# Patient Record
Sex: Female | Born: 1985 | Race: Asian | Hispanic: No | Marital: Single | Smoking: Former smoker
Health system: Southern US, Community
[De-identification: ages and names within clinical notes are randomized; demographics above are authoritative.]

## PROBLEM LIST (undated history)

## (undated) ENCOUNTER — Inpatient Hospital Stay (HOSPITAL_COMMUNITY): Payer: Self-pay

## (undated) DIAGNOSIS — A64 Unspecified sexually transmitted disease: Secondary | ICD-10-CM

## (undated) DIAGNOSIS — F32A Depression, unspecified: Secondary | ICD-10-CM

## (undated) DIAGNOSIS — F1121 Opioid dependence, in remission: Secondary | ICD-10-CM

## (undated) DIAGNOSIS — D563 Thalassemia minor: Secondary | ICD-10-CM

## (undated) DIAGNOSIS — M542 Cervicalgia: Secondary | ICD-10-CM

## (undated) DIAGNOSIS — R51 Headache: Secondary | ICD-10-CM

## (undated) DIAGNOSIS — R87629 Unspecified abnormal cytological findings in specimens from vagina: Secondary | ICD-10-CM

## (undated) DIAGNOSIS — M549 Dorsalgia, unspecified: Secondary | ICD-10-CM

## (undated) DIAGNOSIS — F329 Major depressive disorder, single episode, unspecified: Secondary | ICD-10-CM

## (undated) DIAGNOSIS — B999 Unspecified infectious disease: Secondary | ICD-10-CM

## (undated) DIAGNOSIS — B009 Herpesviral infection, unspecified: Secondary | ICD-10-CM

## (undated) DIAGNOSIS — G8929 Other chronic pain: Secondary | ICD-10-CM

## (undated) DIAGNOSIS — D649 Anemia, unspecified: Secondary | ICD-10-CM

## (undated) HISTORY — DX: Thalassemia minor: D56.3

## (undated) HISTORY — DX: Depression, unspecified: F32.A

## (undated) HISTORY — PX: NO PAST SURGERIES: SHX2092

## (undated) HISTORY — DX: Cervicalgia: M54.2

---
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
## 2008-01-28 ENCOUNTER — Emergency Department (HOSPITAL_COMMUNITY): Admission: EM | Admit: 2008-01-28 | Discharge: 2008-01-28 | Payer: Self-pay | Admitting: Emergency Medicine

## 2008-02-11 ENCOUNTER — Emergency Department (HOSPITAL_COMMUNITY): Admission: EM | Admit: 2008-02-11 | Discharge: 2008-02-11 | Payer: Self-pay | Admitting: Emergency Medicine

## 2008-06-16 ENCOUNTER — Emergency Department (HOSPITAL_COMMUNITY): Admission: EM | Admit: 2008-06-16 | Discharge: 2008-06-16 | Payer: Self-pay | Admitting: Emergency Medicine

## 2008-08-26 ENCOUNTER — Emergency Department (HOSPITAL_COMMUNITY): Admission: EM | Admit: 2008-08-26 | Discharge: 2008-08-26 | Payer: Self-pay | Admitting: Emergency Medicine

## 2009-04-07 ENCOUNTER — Emergency Department (HOSPITAL_COMMUNITY): Admission: EM | Admit: 2009-04-07 | Discharge: 2009-04-07 | Payer: Self-pay | Admitting: Emergency Medicine

## 2009-10-11 ENCOUNTER — Emergency Department (HOSPITAL_COMMUNITY): Admission: EM | Admit: 2009-10-11 | Discharge: 2009-10-11 | Payer: Self-pay | Admitting: Emergency Medicine

## 2010-04-12 ENCOUNTER — Emergency Department (HOSPITAL_COMMUNITY)
Admission: EM | Admit: 2010-04-12 | Discharge: 2010-04-13 | Payer: Self-pay | Source: Home / Self Care | Admitting: Emergency Medicine

## 2010-05-04 ENCOUNTER — Emergency Department (HOSPITAL_COMMUNITY)
Admission: EM | Admit: 2010-05-04 | Discharge: 2010-05-04 | Payer: Self-pay | Source: Home / Self Care | Admitting: Emergency Medicine

## 2010-05-04 LAB — URINE MICROSCOPIC-ADD ON

## 2010-05-04 LAB — URINALYSIS, ROUTINE W REFLEX MICROSCOPIC
Bilirubin Urine: NEGATIVE
Hemoglobin, Urine: NEGATIVE
Ketones, ur: NEGATIVE mg/dL
Nitrite: NEGATIVE
Protein, ur: NEGATIVE mg/dL
Specific Gravity, Urine: 1.026 (ref 1.005–1.030)
Urine Glucose, Fasting: NEGATIVE mg/dL
Urobilinogen, UA: 1 mg/dL (ref 0.0–1.0)
pH: 7 (ref 5.0–8.0)

## 2010-05-04 LAB — POCT PREGNANCY, URINE: Preg Test, Ur: NEGATIVE

## 2010-05-26 LAB — URINE CULTURE
Colony Count: >=100000
Culture  Setup Time: 201201042348

## 2010-07-12 ENCOUNTER — Emergency Department (HOSPITAL_COMMUNITY)
Admission: EM | Admit: 2010-07-12 | Discharge: 2010-07-12 | Disposition: A | Discharge status: Home / Self Care | Payer: Self-pay | Attending: Emergency Medicine | Admitting: Emergency Medicine

## 2010-07-12 DIAGNOSIS — R1110 Vomiting, unspecified: Secondary | ICD-10-CM | POA: Insufficient documentation

## 2010-07-12 DIAGNOSIS — R109 Unspecified abdominal pain: Secondary | ICD-10-CM | POA: Insufficient documentation

## 2010-07-12 LAB — URINALYSIS, ROUTINE W REFLEX MICROSCOPIC
Bilirubin Urine: NEGATIVE
Bilirubin Urine: NEGATIVE
Glucose, UA: NEGATIVE mg/dL
Glucose, UA: NEGATIVE mg/dL
Hgb urine dipstick: NEGATIVE
Hgb urine dipstick: NEGATIVE
Ketones, ur: NEGATIVE mg/dL
Ketones, ur: NEGATIVE mg/dL
Nitrite: NEGATIVE
Nitrite: NEGATIVE
Protein, ur: NEGATIVE mg/dL
Protein, ur: NEGATIVE mg/dL
Specific Gravity, Urine: 1.026 (ref 1.005–1.030)
Specific Gravity, Urine: 1.03 (ref 1.005–1.030)
Urobilinogen, UA: 0.2 mg/dL (ref 0.0–1.0)
Urobilinogen, UA: 1 mg/dL (ref 0.0–1.0)
pH: 7 (ref 5.0–8.0)
pH: 7.5 (ref 5.0–8.0)

## 2010-07-12 LAB — COMPREHENSIVE METABOLIC PANEL
ALT: 14 U/L (ref 0–35)
AST: 18 U/L (ref 0–37)
Albumin: 3.9 g/dL (ref 3.5–5.2)
Alkaline Phosphatase: 60 U/L (ref 39–117)
BUN: 17 mg/dL (ref 6–23)
CO2: 26 mEq/L (ref 19–32)
Calcium: 8.7 mg/dL (ref 8.4–10.5)
Chloride: 105 mEq/L (ref 96–112)
Creatinine, Ser: 0.77 mg/dL (ref 0.4–1.2)
GFR calc Af Amer: >60 mL/min (ref >60)
GFR calc non Af Amer: >60 mL/min (ref >60)
Glucose, Bld: 72 mg/dL (ref 70–99)
Potassium: 3.6 mEq/L (ref 3.5–5.1)
Sodium: 138 mEq/L (ref 135–145)
Total Bilirubin: 0.6 mg/dL (ref 0.3–1.2)
Total Protein: 7.5 g/dL (ref 6.0–8.3)

## 2010-07-12 LAB — POCT PREGNANCY, URINE
Preg Test, Ur: NEGATIVE
Preg Test, Ur: NEGATIVE

## 2010-07-12 LAB — DIFFERENTIAL
Basophils Absolute: 0 10*3/uL (ref 0.0–0.1)
Basophils Absolute: 0.1 10*3/uL (ref 0.0–0.1)
Basophils Relative: 0 % (ref 0–1)
Basophils Relative: 1 % (ref 0–1)
Eosinophils Absolute: 0.2 10*3/uL (ref 0.0–0.7)
Eosinophils Absolute: 0.2 10*3/uL (ref 0.0–0.7)
Eosinophils Relative: 2 % (ref 0–5)
Eosinophils Relative: 2 % (ref 0–5)
Lymphocytes Relative: 23 % (ref 12–46)
Lymphocytes Relative: 29 % (ref 12–46)
Lymphs Abs: 2.2 10*3/uL (ref 0.7–4.0)
Lymphs Abs: 3 10*3/uL (ref 0.7–4.0)
Monocytes Absolute: 0.7 10*3/uL (ref 0.1–1.0)
Monocytes Absolute: 0.8 10*3/uL (ref 0.1–1.0)
Monocytes Relative: 8 % (ref 3–12)
Monocytes Relative: 8 % (ref 3–12)
Neutro Abs: 6.3 10*3/uL (ref 1.7–7.7)
Neutro Abs: 6.3 10*3/uL (ref 1.7–7.7)
Neutrophils Relative %: 61 % (ref 43–77)
Neutrophils Relative %: 67 % (ref 43–77)

## 2010-07-12 LAB — WET PREP, GENITAL

## 2010-07-12 LAB — CBC
HCT: 42.2 % (ref 36.0–46.0)
HCT: 43.7 % (ref 36.0–46.0)
Hemoglobin: 13.2 g/dL (ref 12.0–15.0)
Hemoglobin: 15.4 g/dL — ABNORMAL HIGH (ref 12.0–15.0)
MCH: 24.2 pg — ABNORMAL LOW (ref 26.0–34.0)
MCH: 31.2 pg (ref 26.0–34.0)
MCHC: 31.3 g/dL (ref 30.0–36.0)
MCHC: 35.2 g/dL (ref 30.0–36.0)
MCV: 77.3 fL — ABNORMAL LOW (ref 78.0–100.0)
MCV: 88.5 fL (ref 78.0–100.0)
Platelets: 226 10*3/uL (ref 150–400)
Platelets: 299 10*3/uL (ref 150–400)
RBC: 4.94 MIL/uL (ref 3.87–5.11)
RBC: 5.46 MIL/uL — ABNORMAL HIGH (ref 3.87–5.11)
RDW: 13.3 % (ref 11.5–15.5)
RDW: 15.1 % (ref 11.5–15.5)
WBC: 10.3 10*3/uL (ref 4.0–10.5)
WBC: 9.4 10*3/uL (ref 4.0–10.5)

## 2010-07-12 LAB — BASIC METABOLIC PANEL
BUN: 16 mg/dL (ref 6–23)
CO2: 23 mEq/L (ref 19–32)
Calcium: 8.9 mg/dL (ref 8.4–10.5)
Chloride: 105 mEq/L (ref 96–112)
Creatinine, Ser: 0.78 mg/dL (ref 0.4–1.2)
GFR calc Af Amer: >60 mL/min (ref >60)
GFR calc non Af Amer: >60 mL/min (ref >60)
Glucose, Bld: 85 mg/dL (ref 70–99)
Potassium: 3.5 mEq/L (ref 3.5–5.1)
Sodium: 135 mEq/L (ref 135–145)

## 2010-07-12 LAB — URINE MICROSCOPIC-ADD ON

## 2010-07-12 LAB — GC/CHLAMYDIA PROBE AMP, GENITAL
Chlamydia, DNA Probe: NEGATIVE
GC Probe Amp, Genital: NEGATIVE

## 2010-07-12 LAB — LIPASE, BLOOD
Lipase: 46 U/L (ref 11–59)
Lipase: 48 U/L (ref 11–59)

## 2010-07-13 LAB — URINE CULTURE
Colony Count: >=100000
Culture  Setup Time: 201203132220

## 2010-07-18 LAB — RAPID STREP SCREEN (MED CTR MEBANE ONLY): Streptococcus, Group A Screen (Direct): NEGATIVE

## 2010-08-02 LAB — POCT I-STAT, CHEM 8
BUN: 12 mg/dL (ref 6–23)
Calcium, Ion: 1.2 mmol/L (ref 1.12–1.32)
Chloride: 106 mEq/L (ref 96–112)
Creatinine, Ser: 0.8 mg/dL (ref 0.4–1.2)
Glucose, Bld: 97 mg/dL (ref 70–99)
HCT: 44 % (ref 36.0–46.0)
Hemoglobin: 15 g/dL (ref 12.0–15.0)
Potassium: 3.4 mEq/L — ABNORMAL LOW (ref 3.5–5.1)
Sodium: 142 mEq/L (ref 135–145)
TCO2: 28 mmol/L (ref 0–100)

## 2010-08-02 LAB — POCT PREGNANCY, URINE: Preg Test, Ur: NEGATIVE

## 2010-08-16 LAB — POCT URINALYSIS DIP (DEVICE)
Bilirubin Urine: NEGATIVE
Ketones, ur: NEGATIVE mg/dL
Protein, ur: NEGATIVE mg/dL

## 2010-08-16 LAB — WET PREP, GENITAL: Yeast Wet Prep HPF POC: NONE SEEN

## 2010-08-16 LAB — POCT PREGNANCY, URINE: Preg Test, Ur: NEGATIVE

## 2010-09-13 ENCOUNTER — Emergency Department (HOSPITAL_COMMUNITY)
Admission: EM | Admit: 2010-09-13 | Discharge: 2010-09-13 | Disposition: A | Discharge status: Home / Self Care | Payer: Self-pay | Attending: Emergency Medicine | Admitting: Emergency Medicine

## 2010-09-13 ENCOUNTER — Emergency Department (HOSPITAL_COMMUNITY): Payer: Self-pay

## 2010-09-13 DIAGNOSIS — M549 Dorsalgia, unspecified: Secondary | ICD-10-CM | POA: Insufficient documentation

## 2010-09-13 DIAGNOSIS — R112 Nausea with vomiting, unspecified: Secondary | ICD-10-CM | POA: Insufficient documentation

## 2010-09-13 DIAGNOSIS — R1013 Epigastric pain: Secondary | ICD-10-CM | POA: Insufficient documentation

## 2010-09-13 DIAGNOSIS — R109 Unspecified abdominal pain: Secondary | ICD-10-CM | POA: Insufficient documentation

## 2010-09-13 DIAGNOSIS — R197 Diarrhea, unspecified: Secondary | ICD-10-CM | POA: Insufficient documentation

## 2010-09-13 LAB — DIFFERENTIAL
Basophils Absolute: 0.1 10*3/uL (ref 0.0–0.1)
Basophils Relative: 1 % (ref 0–1)
Eosinophils Absolute: 0.2 10*3/uL (ref 0.0–0.7)
Eosinophils Relative: 2 % (ref 0–5)
Lymphocytes Relative: 21 % (ref 12–46)
Lymphs Abs: 1.8 10*3/uL (ref 0.7–4.0)
Monocytes Absolute: 0.6 10*3/uL (ref 0.1–1.0)
Monocytes Relative: 7 % (ref 3–12)
Neutro Abs: 5.8 10*3/uL (ref 1.7–7.7)
Neutrophils Relative %: 69 % (ref 43–77)

## 2010-09-13 LAB — CBC
HCT: 42 % (ref 36.0–46.0)
Hemoglobin: 13.6 g/dL (ref 12.0–15.0)
MCH: 24.5 pg — ABNORMAL LOW (ref 26.0–34.0)
MCHC: 32.4 g/dL (ref 30.0–36.0)
MCV: 75.8 fL — ABNORMAL LOW (ref 78.0–100.0)
Platelets: 291 10*3/uL (ref 150–400)
RBC: 5.54 MIL/uL — ABNORMAL HIGH (ref 3.87–5.11)
RDW: 15.2 % (ref 11.5–15.5)
WBC: 8.4 10*3/uL (ref 4.0–10.5)

## 2010-09-13 LAB — COMPREHENSIVE METABOLIC PANEL
ALT: 12 U/L (ref 0–35)
AST: 16 U/L (ref 0–37)
Albumin: 3.8 g/dL (ref 3.5–5.2)
Alkaline Phosphatase: 72 U/L (ref 39–117)
BUN: 11 mg/dL (ref 6–23)
CO2: 27 mEq/L (ref 19–32)
Calcium: 9.1 mg/dL (ref 8.4–10.5)
Chloride: 102 mEq/L (ref 96–112)
Creatinine, Ser: 0.63 mg/dL (ref 0.4–1.2)
GFR calc Af Amer: >60 mL/min (ref >60)
GFR calc non Af Amer: >60 mL/min (ref >60)
Glucose, Bld: 74 mg/dL (ref 70–99)
Potassium: 3.9 mEq/L (ref 3.5–5.1)
Sodium: 137 mEq/L (ref 135–145)
Total Bilirubin: 0.4 mg/dL (ref 0.3–1.2)
Total Protein: 7.4 g/dL (ref 6.0–8.3)

## 2010-09-13 LAB — URINALYSIS, ROUTINE W REFLEX MICROSCOPIC
Glucose, UA: NEGATIVE mg/dL
Ketones, ur: NEGATIVE mg/dL
Specific Gravity, Urine: 1.024 (ref 1.005–1.030)
pH: 6.5 (ref 5.0–8.0)

## 2010-09-13 LAB — WET PREP, GENITAL: Yeast Wet Prep HPF POC: NONE SEEN

## 2010-09-13 LAB — LIPASE, BLOOD: Lipase: 48 U/L (ref 11–59)

## 2010-09-14 LAB — GC/CHLAMYDIA PROBE AMP, GENITAL: GC Probe Amp, Genital: NEGATIVE

## 2011-04-03 ENCOUNTER — Encounter: Payer: Self-pay | Admitting: *Deleted

## 2011-04-03 ENCOUNTER — Emergency Department (HOSPITAL_BASED_OUTPATIENT_CLINIC_OR_DEPARTMENT_OTHER)
Admission: EM | Admit: 2011-04-03 | Discharge: 2011-04-04 | Disposition: A | Discharge status: Home / Self Care | Payer: Self-pay | Attending: Emergency Medicine | Admitting: Emergency Medicine

## 2011-04-03 DIAGNOSIS — M549 Dorsalgia, unspecified: Secondary | ICD-10-CM | POA: Insufficient documentation

## 2011-04-03 DIAGNOSIS — R51 Headache: Secondary | ICD-10-CM | POA: Insufficient documentation

## 2011-04-03 LAB — URINALYSIS, ROUTINE W REFLEX MICROSCOPIC
Glucose, UA: NEGATIVE mg/dL
Hgb urine dipstick: NEGATIVE
Leukocytes, UA: NEGATIVE
Specific Gravity, Urine: 1.029 (ref 1.005–1.030)
Urobilinogen, UA: 0.2 mg/dL (ref 0.0–1.0)

## 2011-04-03 LAB — PREGNANCY, URINE: Preg Test, Ur: NEGATIVE

## 2011-04-03 NOTE — ED Notes (Signed)
Pt c/o generalized body aches and fever x 1 week

## 2011-04-04 MED ORDER — OXYCODONE-ACETAMINOPHEN 5-325 MG PO TABS: 1.0000 | ORAL_TABLET | Freq: Four times a day (QID) | ORAL | Status: AC | PRN

## 2011-04-04 MED ORDER — CYCLOBENZAPRINE HCL 10 MG PO TABS: 10.0000 mg | ORAL_TABLET | Freq: Three times a day (TID) | ORAL | Status: AC | PRN

## 2011-04-04 NOTE — ED Provider Notes (Addendum)
History     CSN: [id number] Arrival date & time: 04/03/2011 11:55 PM   First MD Initiated Contact with Patient 04/04/11 0118      Chief Complaint  Patient presents with  . Fever  . Generalized Body Aches    (Consider location/radiation/quality/duration/timing/severity/associated sxs/prior treatment) HPI This is a 25 year old female with a one and a half week history of left lower flank and left lumbar back pain. It is moderate in severity and worse with certain movements and positions, improved with certain positions. She states that sometimes radiates down the back of her legs. She states sometimes her thighs feel numb when she's lying in bed at night. She denies bowel or bladder changes. She denies fever, chills, nausea, vomiting, diarrhea, cough, dyspnea, sore throat or nasal congestion. She does complain of headache. Nursing notes cite generalized body aches and fever for a week but this is not endorsed by the patient. She's been treated for similar pain in the past at [facility] She was given Percocet which helped transiently. She requests referral for specialty care.  History reviewed. No pertinent past medical history.  History reviewed. No pertinent past surgical history.  History reviewed. No pertinent family history.  History  Substance Use Topics  . Smoking status: Never Smoker   . Smokeless tobacco: Not on file  . Alcohol Use: No    OB History    Grav Para Term Preterm Abortions TAB SAB Ect Mult Living                  Review of Systems  All other systems reviewed and are negative.    Allergies  Review of patient's allergies indicates no known allergies.  Home Medications  No current outpatient prescriptions on file.  BP 117/71  Pulse 58  Temp(Src) 99.4 F (37.4 C) (Oral)  Resp 16  Ht 5\' 2"  (1.575 m)  Wt 125 lb (56.7 kg)  BMI 22.86 kg/m2  SpO2 100%  LMP 03/28/2011  Physical Exam General: Well-developed, well-nourished female in no acute  distress; appearance consistent with age of record HENT: normocephalic, atraumatic Eyes: pupils equal round and reactive to light; extraocular muscles intact Neck: supple Heart: regular rate and rhythm Lungs: clear to auscultation bilaterally Abdomen: soft; nontender; nondistended Back: Left lower flank and left sacroiliac tenderness without palpable warmth, swelling or deformity Extremities: No deformity; full range of motion Neurologic: Awake, alert and oriented;motor function intact in all extremities and symmetric; sensation normal; no facial droop Skin: Warm and dry Psychiatric: Normal mood and affect    ED Course  Procedures (including critical care time)    MDM   Nursing notes and vitals signs, including pulse oximetry, reviewed.  Summary of this visit's results, reviewed by myself:  Labs:  Results for orders placed during the hospital encounter of 04/03/11  URINALYSIS, ROUTINE W REFLEX MICROSCOPIC      Component Value Range   Color, Urine YELLOW  YELLOW    APPearance CLEAR  CLEAR    Specific Gravity, Urine 1.029  1.005 - 1.030    pH 5.5  5.0 - 8.0    Glucose, UA NEGATIVE  NEGATIVE (mg/dL)   Hgb urine dipstick NEGATIVE  NEGATIVE    Bilirubin Urine NEGATIVE  NEGATIVE    Ketones, ur 15 (*) NEGATIVE (mg/dL)   Protein, ur NEGATIVE  NEGATIVE (mg/dL)   Urobilinogen, UA 0.2  0.0 - 1.0 (mg/dL)   Nitrite NEGATIVE  NEGATIVE    Leukocytes, UA NEGATIVE  NEGATIVE   PREGNANCY, URINE  Component Value Range   Preg Test, Ur NEGATIVE              Hanley Seamen, MD 04/04/11 0146  Hanley Seamen, MD 04/04/11 0865

## 2011-07-26 ENCOUNTER — Emergency Department (HOSPITAL_COMMUNITY)
Admission: EM | Admit: 2011-07-26 | Discharge: 2011-07-26 | Disposition: A | Discharge status: Home / Self Care | Payer: Self-pay | Attending: Emergency Medicine | Admitting: Emergency Medicine

## 2011-07-26 ENCOUNTER — Encounter (HOSPITAL_COMMUNITY): Payer: Self-pay | Admitting: *Deleted

## 2011-07-26 DIAGNOSIS — M549 Dorsalgia, unspecified: Secondary | ICD-10-CM | POA: Insufficient documentation

## 2011-07-26 MED ORDER — METHOCARBAMOL 500 MG PO TABS: 500.0000 mg | ORAL_TABLET | Freq: Two times a day (BID) | ORAL | Status: AC

## 2011-07-26 MED ORDER — OXYCODONE-ACETAMINOPHEN 5-325 MG PO TABS: 2.0000 | ORAL_TABLET | ORAL | Status: AC | PRN
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
## 2012-02-21 ENCOUNTER — Emergency Department (HOSPITAL_COMMUNITY)
Admission: EM | Admit: 2012-02-21 | Discharge: 2012-02-21 | Disposition: A | Discharge status: Home / Self Care | Payer: Self-pay | Attending: Emergency Medicine | Admitting: Emergency Medicine

## 2012-02-21 ENCOUNTER — Emergency Department (HOSPITAL_COMMUNITY): Payer: Self-pay

## 2012-02-21 ENCOUNTER — Encounter (HOSPITAL_COMMUNITY): Payer: Self-pay | Admitting: Emergency Medicine

## 2012-02-21 DIAGNOSIS — M549 Dorsalgia, unspecified: Secondary | ICD-10-CM | POA: Insufficient documentation

## 2012-02-21 DIAGNOSIS — G43909 Migraine, unspecified, not intractable, without status migrainosus: Secondary | ICD-10-CM | POA: Insufficient documentation

## 2012-02-21 DIAGNOSIS — Z79899 Other long term (current) drug therapy: Secondary | ICD-10-CM | POA: Insufficient documentation

## 2012-02-21 DIAGNOSIS — Z87891 Personal history of nicotine dependence: Secondary | ICD-10-CM | POA: Insufficient documentation

## 2012-02-21 DIAGNOSIS — G8929 Other chronic pain: Secondary | ICD-10-CM | POA: Insufficient documentation

## 2012-02-21 HISTORY — DX: Dorsalgia, unspecified: M54.9

## 2012-02-21 HISTORY — DX: Other chronic pain: G89.29

## 2012-02-21 HISTORY — DX: Headache: R51

## 2012-02-21 MED ORDER — SUMATRIPTAN SUCCINATE 100 MG PO TABS: 100.0000 mg | ORAL_TABLET | ORAL | Status: DC | PRN

## 2012-02-21 MED ORDER — KETOROLAC TROMETHAMINE 60 MG/2ML IM SOLN
60.0000 mg | Freq: Once | INTRAMUSCULAR | Status: AC
  Administered 2012-02-21: 60 mg via INTRAMUSCULAR
  Filled 2012-02-21: qty 2

## 2012-02-21 MED ORDER — METOCLOPRAMIDE HCL 10 MG PO TABS
10.0000 mg | ORAL_TABLET | Freq: Once | ORAL | Status: AC
  Administered 2012-02-21: 10 mg via ORAL
  Filled 2012-02-21 (×3): qty 1

## 2012-02-21 MED ORDER — DIPHENHYDRAMINE HCL 25 MG PO CAPS
50.0000 mg | ORAL_CAPSULE | Freq: Once | ORAL | Status: AC
  Administered 2012-02-21: 50 mg via ORAL
  Filled 2012-02-21: qty 2

## 2012-02-21 MED ORDER — OXYCODONE-ACETAMINOPHEN 5-325 MG PO TABS
1.0000 | ORAL_TABLET | Freq: Once | ORAL | Status: AC
  Administered 2012-02-21: 1 via ORAL
  Filled 2012-02-21: qty 1

## 2012-02-21 NOTE — ED Notes (Signed)
Pt reports she tried her friends percocet and it worked to relieve the pain.

## 2012-02-21 NOTE — ED Notes (Signed)
Pt sleeping in room

## 2012-02-21 NOTE — ED Notes (Signed)
Pt c/o HA x 1 month. Pt reports she has been having on and off HAs sometimes she gets nauseous and feels like she has a head cold. Pt c/o knot on right neck behind ear x 2 days, reports it was bigger and has gotten smaller. Pt reports she had the same thing on chest that lasted 2 days then went away.

## 2012-02-21 NOTE — ED Notes (Signed)
C/o headache x 1 month and knot to R side of neck (just below R ear) x 3 days.  Pt reports intermittent syncopal episodes and blurred vision with headaches over the past month.  No neuro deficits noted on triage exam.

## 2012-02-22 NOTE — ED Provider Notes (Signed)
History     CSN: [id number]  Arrival date & time 02/21/12  1745   First MD Initiated Contact with Patient 02/21/12 1944      Chief Complaint  Patient presents with  . Headache    (Consider location/radiation/quality/duration/timing/severity/associated sxs/prior treatment) Patient is a 26 y.o. female presenting with headaches. The history is provided by the patient. No language interpreter was used.  Headache  This is a recurrent problem. The current episode started more than 2 days ago. The problem occurs constantly. The problem has not changed since onset.The headache is associated with bright light, activity and emotional stress. The pain is located in the occipital region. The quality of the pain is described as sharp. The pain is at a severity of 10/10. The pain is severe. The pain does not radiate. Associated symptoms include syncope and nausea. Pertinent negatives include no fever, no near-syncope, no shortness of breath and no vomiting. She has tried NSAIDs and resting in a darkened room for the symptoms. The treatment provided mild relief.  26yo with recurrent migraine headaches x 1 month with blurred vision intermitanly and no pcp.  States that she has had 2 syncopal episodes in the past week.  Not sure if she hit her head.  Neuro in tact pmh listed below.    Past Medical History  Diagnosis Date  . Headache   . Back pain, chronic     History reviewed. No pertinent past surgical history.  No family history on file.  History  Substance Use Topics  . Smoking status: Former Games developer  . Smokeless tobacco: Not on file  . Alcohol Use: Yes    OB History    Grav Para Term Preterm Abortions TAB SAB Ect Mult Living                  Review of Systems  Constitutional: Negative.  Negative for fever.  HENT: Positive for neck pain.   Eyes: Negative.   Respiratory: Negative.  Negative for shortness of breath.   Cardiovascular: Positive for syncope. Negative for near-syncope.    Gastrointestinal: Positive for nausea. Negative for vomiting.  Neurological: Positive for syncope and headaches. Negative for dizziness and light-headedness.  Psychiatric/Behavioral: Negative.   All other systems reviewed and are negative.    Allergies  Review of patient's allergies indicates no known allergies.  Home Medications   Current Outpatient Rx  Name Route Sig Dispense Refill  . BAYER PLUS EXTRA STRENGTH PO Oral Take 2 tablets by mouth every 6 (six) hours as needed. pain    . SUMATRIPTAN SUCCINATE 100 MG PO TABS Oral Take 1 tablet (100 mg total) by mouth every 2 (two) hours as needed for migraine. 10 tablet 0    BP 103/52  Pulse 56  Temp 98.6 F (37 C) (Oral)  Resp 20  SpO2 99%  LMP 02/17/2012  Physical Exam  Nursing note and vitals reviewed. Constitutional: She is oriented to person, place, and time. She appears well-developed and well-nourished.  HENT:  Head: Normocephalic and atraumatic.  Eyes: Conjunctivae normal and EOM are normal. Pupils are equal, round, and reactive to light.  Neck: Normal range of motion. Neck supple.  Cardiovascular: Normal rate.   Pulmonary/Chest: Effort normal.  Abdominal: Soft.  Musculoskeletal: Normal range of motion. She exhibits no edema and no tenderness.  Lymphadenopathy:       Head (left side): Posterior auricular and occipital adenopathy present.  Neurological: She is alert and oriented to person, place, and time. She has normal  strength and normal reflexes. No cranial nerve deficit or sensory deficit. She displays a negative Romberg sign. GCS eye subscore is 4. GCS verbal subscore is 5. GCS motor subscore is 6.  Skin: Skin is warm and dry.  Psychiatric: She has a normal mood and affect.    ED Course  Procedures (including critical care time)  Labs Reviewed - No data to display Ct Head Wo Contrast  02/21/2012  *RADIOLOGY REPORT*  Clinical Data: Headache, photophobia.  CT HEAD WITHOUT CONTRAST  Technique:  Contiguous  axial images were obtained from the base of the skull through the vertex without contrast.  Comparison: None.  Findings: There is no evidence of acute intracranial hemorrhage, infarction, subdural hematoma or shift of the midline structures. The brain, ventricles and extra-axial spaces are within normal limits.  The bony calvarium is normal.  The skull base is normal.  IMPRESSION: Normal CT of the brain.   Original Report Authenticated By: Mervin Hack, M.D.      1. Migraine       MDM  26 yo with recurrent migraine h/a x 1 month.  Minimal relief with migraine cocktail in the ER.  2/10 after percocet.  Will get a pcp from list to follow up with asap.  Neuro in tact, afebrile.  rx for imitrex.  She understands to return for worsening symptoms.  CT head unremarkable.          Remi Haggard, NP 02/22/12 1928

## 2012-02-22 NOTE — ED Provider Notes (Signed)
Medical screening examination/treatment/procedure(s) were performed by non-physician practitioner and as supervising physician I was immediately available for consultation/collaboration.  Isabele Lollar T Khiya Friese, MD 02/22/12 2226 

## 2012-04-18 ENCOUNTER — Telehealth (HOSPITAL_COMMUNITY): Payer: Self-pay | Admitting: *Deleted

## 2012-04-18 NOTE — Telephone Encounter (Signed)
Telephoned home # and voicemail box has not been set up yet.

## 2012-05-08 ENCOUNTER — Encounter (HOSPITAL_COMMUNITY): Payer: Self-pay | Admitting: *Deleted

## 2012-05-14 ENCOUNTER — Encounter (HOSPITAL_COMMUNITY): Payer: Self-pay

## 2012-05-14 ENCOUNTER — Ambulatory Visit (HOSPITAL_COMMUNITY)
Admission: RE | Admit: 2012-05-14 | Discharge: 2012-05-14 | Disposition: A | Discharge status: Home / Self Care | Payer: Self-pay | Source: Ambulatory Visit | Attending: Obstetrics and Gynecology | Admitting: Obstetrics and Gynecology

## 2012-05-14 VITALS — BP 90/60 | Temp 99.1°F | Ht 62.0 in | Wt 120.2 lb

## 2012-05-14 DIAGNOSIS — IMO0002 Reserved for concepts with insufficient information to code with codable children: Secondary | ICD-10-CM | POA: Insufficient documentation

## 2012-05-14 DIAGNOSIS — Z1239 Encounter for other screening for malignant neoplasm of breast: Secondary | ICD-10-CM

## 2012-05-14 HISTORY — DX: Unspecified sexually transmitted disease: A64

## 2012-05-14 NOTE — Progress Notes (Signed)
Patient referred to [facility] by the [facility] due to having an ASCUS HPV+ Pap smear 03/05/2012.  Pap Smear:    Pap smear not completed today. Last Pap smear was 03/05/2012 at the [facility] Department and ASCUS HPV+. Referred patient to the [facility] Outpatient clinics for Colposcopy. Appointment scheduled for Monday, May 20, 2012 at 1245. Per patient has no history of abnormal Pap smears prior to the most recent Pap smear. Pap smear result to be scanned in EPIC under media.  Physical exam: Breasts Breasts symmetrical. No skin abnormalities bilateral breasts. No nipple retraction bilateral breasts. No nipple discharge bilateral breasts. No lymphadenopathy. No lumps palpated bilateral breasts. No complaints of pain or tenderness on exam.     Pelvic/Bimanual No Pap smear completed today since last Pap smear was 03/05/2012. Pap smear not indicated per [facility]CP guidelines.

## 2012-05-14 NOTE — Patient Instructions (Signed)
Taught patient how to perform BSE and gave educational materials to take home. Patient did not need a Pap smear today due to last Pap smear was 03/05/2012 and abnormal. Discussed abnormal result with patient and the colposcopy that she will need for follow up to patient. Gave patient educational materials to take home and discussed materials. Referred patient to the [facility] Outpatient clinics for Colposcopy. Appointment scheduled for Monday, May 20, 2012 at 1245. Patient aware of appointment and will be there. Patient verbalized understanding.

## 2012-05-20 ENCOUNTER — Ambulatory Visit (INDEPENDENT_AMBULATORY_CARE_PROVIDER_SITE_OTHER): Payer: Self-pay | Admitting: Obstetrics & Gynecology

## 2012-05-20 ENCOUNTER — Encounter: Payer: Self-pay | Admitting: Obstetrics & Gynecology

## 2012-05-20 ENCOUNTER — Other Ambulatory Visit (HOSPITAL_COMMUNITY)
Admission: RE | Admit: 2012-05-20 | Discharge: 2012-05-20 | Disposition: A | Discharge status: Home / Self Care | Payer: Self-pay | Source: Ambulatory Visit | Attending: Obstetrics & Gynecology | Admitting: Obstetrics & Gynecology

## 2012-05-20 VITALS — BP 104/74 | HR 74 | Ht 62.0 in | Wt 119.7 lb

## 2012-05-20 DIAGNOSIS — R87811 Vaginal high risk human papillomavirus (HPV) DNA test positive: Secondary | ICD-10-CM

## 2012-05-20 DIAGNOSIS — N870 Mild cervical dysplasia: Secondary | ICD-10-CM | POA: Insufficient documentation

## 2012-05-20 DIAGNOSIS — IMO0002 Reserved for concepts with insufficient information to code with codable children: Secondary | ICD-10-CM

## 2012-05-20 LAB — POCT PREGNANCY, URINE: Preg Test, Ur: NEGATIVE

## 2012-05-20 NOTE — Progress Notes (Signed)
Patient ID: Kelsey Lynch, female   DOB: 05/28/1985, 27 y.o.   MRN: [id number]  Chief Complaint  Patient presents with  . Colposcopy    ASCUS +HPV    HPI Kelsey Lynch is a 27 y.o. female.  BCCP pap done 03/2012, ASCUS + HR HPV HPI  Indications: Pap smear on November 2013 showed: ASCUS with POSITIVE high risk HPV.   Past Medical History  Diagnosis Date  . Headache   . Back pain, chronic   . STD (sexually transmitted disease)     herpes    History reviewed. No pertinent past surgical history.  Family History  Problem Relation Age of Onset  . Diabetes Paternal Grandmother     Social History History  Substance Use Topics  . Smoking status: Light Tobacco Smoker  . Smokeless tobacco: Never Used  . Alcohol Use: Yes     Comment: occassionally    No Known Allergies  Current Outpatient Prescriptions  Medication Sig Dispense Refill  . acyclovir (ZOVIRAX) 400 MG tablet Take 400 mg by mouth every 4 (four) hours while awake.      . Aspirin Buf,CaCarb-MgCarb-MgO, (BAYER PLUS EXTRA STRENGTH PO) Take 2 tablets by mouth every 6 (six) hours as needed. pain      . SUMAtriptan (IMITREX) 100 MG tablet Take 1 tablet (100 mg total) by mouth every 2 (two) hours as needed for migraine.  10 tablet  0    Review of Systems Review of Systems  Constitutional: Negative for fever.  Genitourinary: Positive for vaginal bleeding and pelvic pain. Negative for dysuria and vaginal discharge.    Blood pressure 104/74, pulse 74, height 5\' 2"  (1.575 m), weight 119 lb 11.2 oz (54.296 kg), last menstrual period 05/06/2012.  Physical Exam Physical Exam  Constitutional: She appears well-nourished. No distress.  Skin: Skin is warm and dry.  Psychiatric: She has a normal mood and affect. Her behavior is normal.    Data Reviewed Pap result  Assessment    Procedure Details  The risks and benefits of the procedure and Written informed consent obtained.  Speculum placed in vagina and excellent  visualization of cervix achieved, cervix swabbed x 3 with acetic acid solution. SCJ visible,, small areas of AWE at 11, 12, 1:00, c/w LSIL,   Specimens: ECC and BX 1200  Complications: none.     Plan    Specimens labelled and sent to Pathology. Return to discuss Pathology results in 2 weeks.      Kelsey Lynch 05/20/2012, 1:19 PM

## 2012-05-20 NOTE — Patient Instructions (Signed)
Colposcopy  Colposcopy is a procedure that uses a special lighted microscope (colposcope). It examines your cervix and vagina, or the area around the outside of the vagina, for signs of disease or abnormalities in the cells. You may be sent to a specialist (gynecologist) to do the colposcopy. A biopsy (tissue sample) may be collected during a colposcopy, if the caregiver finds any unusual cells. The biopsy is sent to the lab for further testing, and the results are reported back to your caregiver.  A WOMAN MAY NEED THIS PROCEDURE IF:  · She has had an abnormal pap smear (taking cells from the cervix for testing).  · She has a sore on her cervix, and a Pap test was normal.  · The Pap test suggests human papilloma virus (HPV). This virus can cause genital warts and is linked to the development of cervical cancer.  · She has genital warts on the cervix, or in or around the outside of the vagina.  · Her mother took the drug DES while pregnant.  · She has painful intercourse.  · She has vaginal bleeding, especially after sexual intercourse.  · There is a need to evaluate the results of previous treatment.  BEFORE THE PROCEDURE   · Colposcopy is done when you are not having a menstrual period.  · For 24 hours before the colposcopy, do not:  · Douche.  · Use tampons.  · Use medicines, creams, or suppositories in the vagina.  · Have sexual intercourse.  PROCEDURE   · A colposcopy is done while a woman is lying on her back with her feet in foot rests (stirrups).  · A speculum is placed inside the vagina to keep it open and to allow the caregiver to see the cervix. This is the same instrument used to do a pap smear.  · The colposcope is placed outside the vagina. It is used to magnify and examine the cervix, vagina, and the area around the outside of the vagina.  · A small amount of liquid solution is placed on the area that is to be viewed. This solution is placed on with a cotton applicator. This solution makes it easier to  see the abnormal cells.  · Your caregiver will suck out mucus and cells from the canal of the cervix.  · Small pieces of tissue for biopsy may be taken at the same time. You may feel mild pain or discomfort when this is done.  · Your caregiver will record the location of the abnormal areas and send the tissue samples to a lab for analysis.  · If your caregiver biopsies the vagina or outside of the vagina, a local anesthetic (novocaine) is usually given.  AFTER THE PROCEDURE   · You may have some cramping that often goes away in a few minutes. You may have some soreness for a couple of days.  · You may take over-the-counter pain medicine as advised by your caregiver. Do not take aspirin because it can cause bleeding.  · Lie down for a few minutes if you feel lightheaded.  · You may have some bleeding or dark discharge that should stop in a few days.  · You may need to wear a sanitary pad for a few days.  HOME CARE INSTRUCTIONS   · Avoid sex, douching, and using tampons for a week or as directed.  · Only take medicine as directed by your caregiver.  · Continue to take birth control pills, if you are on them.  ·   Not all test results are available during your visit. If your test results are not back during the visit, make an appointment with your caregiver to find out the results. Do not assume everything is normal if you have not heard from your caregiver or the medical facility. It is important for you to follow up on all of your test results.  · Follow your caregiver's advice regarding medicines, activity, follow-up visits, and follow-up Pap tests.  SEEK MEDICAL CARE IF:   · You develop a rash.  · You have problems with your medicine.  SEEK IMMEDIATE MEDICAL CARE IF:  · You are bleeding heavily or are passing blood clots.  · You develop a fever over 102° F (38.9° C), with or without chills.  · You have abnormal vaginal discharge.  · You are having cramps that do not go away after taking your pain medicine.  · You  feel lightheaded, dizzy, or faint.  · You develop stomach pain.  Document Released: 07/08/2002 Document Revised: 07/10/2011 Document Reviewed: 02/18/2009  Exit[employer]® Patient Information ©2013 [employer].

## 2012-05-22 ENCOUNTER — Encounter: Payer: Self-pay | Admitting: *Deleted

## 2012-06-03 ENCOUNTER — Ambulatory Visit (INDEPENDENT_AMBULATORY_CARE_PROVIDER_SITE_OTHER): Payer: Self-pay | Admitting: Obstetrics & Gynecology

## 2012-06-03 ENCOUNTER — Encounter: Payer: Self-pay | Admitting: Obstetrics & Gynecology

## 2012-06-03 VITALS — BP 109/67 | HR 77 | Temp 97.9°F | Ht 62.0 in | Wt 119.0 lb

## 2012-06-03 DIAGNOSIS — N870 Mild cervical dysplasia: Secondary | ICD-10-CM

## 2012-06-03 NOTE — Progress Notes (Signed)
Patient ID: Kelsey Lynch, female   DOB: 10/20/1985, 27 y.o.   MRN: [id number] Patient's last menstrual period was 05/06/2012. Colposcopy done 05/20/12, pap was ASCUS pos HR HPV.  Biopsy CIN 1 neg ECC  Recommend return 12 mo pap and cotesting  Daleyssa Loiselle 06/03/2012 4:01 PM

## 2012-06-03 NOTE — Patient Instructions (Signed)

## 2012-10-08 ENCOUNTER — Encounter (HOSPITAL_COMMUNITY): Payer: Self-pay | Admitting: Emergency Medicine

## 2012-10-08 ENCOUNTER — Emergency Department (HOSPITAL_COMMUNITY)
Admission: EM | Admit: 2012-10-08 | Discharge: 2012-10-08 | Disposition: A | Discharge status: Home / Self Care | Payer: Self-pay | Attending: Emergency Medicine | Admitting: Emergency Medicine

## 2012-10-08 ENCOUNTER — Emergency Department (HOSPITAL_COMMUNITY): Payer: Self-pay

## 2012-10-08 DIAGNOSIS — M545 Low back pain, unspecified: Secondary | ICD-10-CM | POA: Insufficient documentation

## 2012-10-08 DIAGNOSIS — Z792 Long term (current) use of antibiotics: Secondary | ICD-10-CM | POA: Insufficient documentation

## 2012-10-08 DIAGNOSIS — R110 Nausea: Secondary | ICD-10-CM | POA: Insufficient documentation

## 2012-10-08 DIAGNOSIS — N949 Unspecified condition associated with female genital organs and menstrual cycle: Secondary | ICD-10-CM | POA: Insufficient documentation

## 2012-10-08 DIAGNOSIS — N939 Abnormal uterine and vaginal bleeding, unspecified: Secondary | ICD-10-CM

## 2012-10-08 DIAGNOSIS — R109 Unspecified abdominal pain: Secondary | ICD-10-CM | POA: Insufficient documentation

## 2012-10-08 DIAGNOSIS — Z8679 Personal history of other diseases of the circulatory system: Secondary | ICD-10-CM | POA: Insufficient documentation

## 2012-10-08 DIAGNOSIS — Z8619 Personal history of other infectious and parasitic diseases: Secondary | ICD-10-CM | POA: Insufficient documentation

## 2012-10-08 DIAGNOSIS — N938 Other specified abnormal uterine and vaginal bleeding: Secondary | ICD-10-CM | POA: Insufficient documentation

## 2012-10-08 DIAGNOSIS — Z3202 Encounter for pregnancy test, result negative: Secondary | ICD-10-CM | POA: Insufficient documentation

## 2012-10-08 DIAGNOSIS — F17200 Nicotine dependence, unspecified, uncomplicated: Secondary | ICD-10-CM | POA: Insufficient documentation

## 2012-10-08 DIAGNOSIS — R5381 Other malaise: Secondary | ICD-10-CM | POA: Insufficient documentation

## 2012-10-08 LAB — URINALYSIS, ROUTINE W REFLEX MICROSCOPIC
Bilirubin Urine: NEGATIVE
Glucose, UA: NEGATIVE mg/dL
Specific Gravity, Urine: 1.025 (ref 1.005–1.030)
Urobilinogen, UA: 1 mg/dL (ref 0.0–1.0)
pH: 7.5 (ref 5.0–8.0)

## 2012-10-08 LAB — WET PREP, GENITAL: Trich, Wet Prep: NONE SEEN

## 2012-10-08 LAB — POCT I-STAT, CHEM 8
BUN: 10 mg/dL (ref 6–23)
Calcium, Ion: 1.16 mmol/L (ref 1.12–1.23)
Chloride: 107 mEq/L (ref 96–112)
Creatinine, Ser: 0.9 mg/dL (ref 0.50–1.10)
Sodium: 140 mEq/L (ref 135–145)

## 2012-10-08 LAB — CBC
Hemoglobin: 12.6 g/dL (ref 12.0–15.0)
MCV: 76.4 fL — ABNORMAL LOW (ref 78.0–100.0)
Platelets: 265 10*3/uL (ref 150–400)
RBC: 5.04 MIL/uL (ref 3.87–5.11)
WBC: 6.3 10*3/uL (ref 4.0–10.5)

## 2012-10-08 LAB — URINE MICROSCOPIC-ADD ON

## 2012-10-08 LAB — POCT PREGNANCY, URINE: Preg Test, Ur: NEGATIVE

## 2012-10-08 MED ORDER — NORETHINDRONE ACET-ETHINYL EST 1-20 MG-MCG PO TABS: 1.0000 | ORAL_TABLET | Freq: Every day | ORAL | Status: DC

## 2012-10-08 MED ORDER — HYDROCODONE-ACETAMINOPHEN 5-325 MG PO TABS
2.0000 | ORAL_TABLET | Freq: Once | ORAL | Status: AC
  Administered 2012-10-08: 2 via ORAL
  Filled 2012-10-08: qty 2

## 2012-10-08 NOTE — ED Notes (Signed)
Pt returned from radiology.

## 2012-10-08 NOTE — ED Notes (Signed)
Pt c/o vaginal bleeding X 1 month, along with bilateral lower abd pain X few weeks but worsened on Saturday night. Pt denies vaginal d/c other than bld. Pt reports she went to health department 2 weeks ago, reports she was tested for STDs but nothing came back positive. Pt was told if the bleeding got worse to go to hospital. Pt denies birth control use. Pt in nad, skin warm and dry, resp e/u, ambulatory to room.

## 2012-10-08 NOTE — ED Notes (Signed)
Pt c/o vaginal bleeding x 1 month that is spotting at present with some lower abd pain

## 2012-10-08 NOTE — ED Provider Notes (Signed)
History     CSN: [id number]  Arrival date & time 10/08/12  1224   First MD Initiated Contact with Patient 10/08/12 1420      Chief Complaint  Patient presents with  . Vaginal Bleeding    (Consider location/radiation/quality/duration/timing/severity/associated sxs/prior treatment) HPI Comments: 27 year old female presents the emergency department complaining of vaginal bleeding over the past month and half, currently spotting with associated lower abdominal pain. States she stopped her birth control but a month and a half ago and has been bleeding since, over the past week it has been spotting. She was on depo. Always has to have a tampon or pad on. Abdominal pain described as cramping with occasional sharp spurts, located in her lower abdominal region radiating towards the left and her back. She tried taking Tylenol twice daily without relief. Admits to associated decreased appetite, food makes her nauseous. No vomiting. Denies changes in bowel habits. Denies increased urinary frequency, urgency or dysuria. She went to the health department 2 weeks ago to discuss this issue, they told her that it was not her birth control and that if he continued to the emergency department. States she had STD testing at that time which was negative. She's beginning to feel weak and tired. Denies lightheadedness or dizziness.  Patient is a 27 y.o. female presenting with vaginal bleeding. The history is provided by the patient.  Vaginal Bleeding Associated symptoms: abdominal pain, back pain, fatigue and nausea   Associated symptoms: no dizziness, no dysuria and no fever     Past Medical History  Diagnosis Date  . Headache(784.0)   . Back pain, chronic   . STD (sexually transmitted disease)     herpes    History reviewed. No pertinent past surgical history.  Family History  Problem Relation Age of Onset  . Diabetes Paternal Grandmother     History  Substance Use Topics  . Smoking status: Light  Tobacco Smoker  . Smokeless tobacco: Never Used  . Alcohol Use: Yes     Comment: occassionally    OB History   Grav Para Term Preterm Abortions TAB SAB Ect Mult Living   0               Review of Systems  Constitutional: Positive for appetite change and fatigue. Negative for fever and chills.  Gastrointestinal: Positive for nausea and abdominal pain. Negative for vomiting, diarrhea and constipation.  Genitourinary: Positive for vaginal bleeding and menstrual problem. Negative for dysuria, urgency, frequency and flank pain.  Musculoskeletal: Positive for back pain.  Neurological: Positive for weakness. Negative for dizziness, light-headedness and headaches.  All other systems reviewed and are negative.    Allergies  Review of patient's allergies indicates no known allergies.  Home Medications   Current Outpatient Rx  Name  Route  Sig  Dispense  Refill  . acetaminophen (TYLENOL) 500 MG tablet   Oral   Take 1,000 mg by mouth every 6 (six) hours as needed for pain.         Marland Kitchen acyclovir (ZOVIRAX) 400 MG tablet   Oral   Take 400 mg by mouth 2 (two) times daily.            BP 118/80  Pulse 65  Temp(Src) 98.4 F (36.9 C) (Oral)  Resp 18  SpO2 100%  Physical Exam  Nursing note and vitals reviewed. Constitutional: She is oriented to person, place, and time. She appears well-developed and well-nourished. No distress.  HENT:  Head: Normocephalic and atraumatic.  Mouth/Throat: Oropharynx is clear and moist.  Eyes: Conjunctivae and EOM are normal. Pupils are equal, round, and reactive to light. No scleral icterus.  Neck: Normal range of motion. Neck supple.  Cardiovascular: Normal rate, regular rhythm and normal heart sounds.   Pulmonary/Chest: Effort normal and breath sounds normal.  Abdominal: Soft. Bowel sounds are normal. She exhibits no distension and no mass. There is tenderness in the suprapubic area and left lower quadrant. There is no rigidity, no rebound, no  guarding and no CVA tenderness.  Genitourinary: There is no rash, tenderness, lesion or injury on the right labia. There is no rash, tenderness, lesion or injury on the left labia. Uterus is tender. Uterus is not deviated, not enlarged and not fixed. Cervix exhibits no motion tenderness, no discharge and no friability. Right adnexum displays no mass, no tenderness and no fullness. Left adnexum displays tenderness. Left adnexum displays no mass and no fullness. There is tenderness around the vagina. No erythema or bleeding around the vagina. No vaginal discharge found.  Musculoskeletal: Normal range of motion. She exhibits no edema.  Neurological: She is alert and oriented to person, place, and time.  Skin: Skin is warm and dry. She is not diaphoretic.  Psychiatric: She has a normal mood and affect. Her behavior is normal.    ED Course  Procedures (including critical care time)  Labs Reviewed  URINALYSIS, ROUTINE W REFLEX MICROSCOPIC - Abnormal; Notable for the following:    APPearance CLOUDY (*)    Hgb urine dipstick SMALL (*)    Leukocytes, UA SMALL (*)    All other components within normal limits  GC/CHLAMYDIA PROBE AMP  WET PREP, GENITAL  URINE MICROSCOPIC-ADD ON  CBC  POCT PREGNANCY, URINE   US Transvaginal Non-ob  10/08/2012   *RADIOLOGY REPORT*  Clinical Data: Abnormal uterine bleeding for past 2 months. Premenopausal female with uncertain LMP.  TRANSABDOMINAL AND TRANSVAGINAL ULTRASOUND OF PELVIS  Technique:  Both transabdominal and transvaginal ultrasound examinations of the pelvis were performed.  Transabdominal technique was performed for global imaging of the pelvis including uterus, ovaries, adnexal regions, and pelvic cul-de-sac.  It was necessary to proceed with endovaginal exam following the transabdominal exam to visualize the endometrium and ovaries.  Comparison:  None.  Findings: Uterus:  6.7 x 2.9 x 3.8 cm.  No fibroids or other uterine masses are identified.  Endometrium:  Double layer thickness measures 5 mm transvaginally. No focal lesion visualized.  Right ovary: 2.5 x 1.5 x 2.4 cm.  Normal appearance.  No adnexal mass identified.  Left ovary: 3.5 x 1.9 x 1.6 cm.  Normal appearance.  No adnexal mass identified.  Other Findings:  No free fluid  IMPRESSION:  1.  No evidence of fibroids.  Endometrial thickness measures 5 mm. If bleeding remains unresponsive to hormonal or medical therapy, sonohysterogram should be considered for focal lesion work-up. (Ref:  Radiological Reasoning: Algorithmic Workup of Abnormal Vaginal Bleeding with Endovaginal Sonography and Sonohysterography. AJR 2008; 161:W96-04) 2.  Normal ovaries.  No adnexal mass identified.   Original Report Authenticated By: Myles Rosenthal, M.D.   US Pelvis Complete  10/08/2012   *RADIOLOGY REPORT*  Clinical Data: Abnormal uterine bleeding for past 2 months. Premenopausal female with uncertain LMP.  TRANSABDOMINAL AND TRANSVAGINAL ULTRASOUND OF PELVIS  Technique:  Both transabdominal and transvaginal ultrasound examinations of the pelvis were performed.  Transabdominal technique was performed for global imaging of the pelvis including uterus, ovaries, adnexal regions, and pelvic cul-de-sac.  It was necessary to proceed with  endovaginal exam following the transabdominal exam to visualize the endometrium and ovaries.  Comparison:  None.  Findings: Uterus:  6.7 x 2.9 x 3.8 cm.  No fibroids or other uterine masses are identified.  Endometrium: Double layer thickness measures 5 mm transvaginally. No focal lesion visualized.  Right ovary: 2.5 x 1.5 x 2.4 cm.  Normal appearance.  No adnexal mass identified.  Left ovary: 3.5 x 1.9 x 1.6 cm.  Normal appearance.  No adnexal mass identified.  Other Findings:  No free fluid  IMPRESSION:  1.  No evidence of fibroids.  Endometrial thickness measures 5 mm. If bleeding remains unresponsive to hormonal or medical therapy, sonohysterogram should be considered for focal lesion work-up. (Ref:   Radiological Reasoning: Algorithmic Workup of Abnormal Vaginal Bleeding with Endovaginal Sonography and Sonohysterography. AJR 2008; 409:W11-91) 2.  Normal ovaries.  No adnexal mass identified.   Original Report Authenticated By: Myles Rosenthal, M.D.     1. Vaginal bleeding       MDM  54 show female with dysfunctional uterine bleeding. Pelvic ultrasound obtained showing endometrial thickness measuring 5 mm. I believe this is from her stopping Depo-Provera shots. I prescribed her birth control pills Loestrin. [facility]. Return precautions discussed. Patient states understanding of plan and is agreeable.        Trevor Mace, PA-C 10/08/12 [phone]

## 2012-10-08 NOTE — ED Provider Notes (Signed)
Medical screening examination/treatment/procedure(s) were performed by non-physician practitioner and as supervising physician I was immediately available for consultation/collaboration.   Sharisa Toves, MD 10/08/12 2351 

## 2012-10-08 NOTE — ED Notes (Signed)
Patient transported to Ultrasound 

## 2012-10-08 NOTE — ED Notes (Signed)
Spoke with main lab, Kelsey Lynch, reports she hasn't received the wet prep yet.

## 2012-10-08 NOTE — ED Notes (Signed)
Patient is resting comfortably. 

## 2012-10-09 LAB — GC/CHLAMYDIA PROBE AMP: CT Probe RNA: NEGATIVE

## 2013-02-05 ENCOUNTER — Emergency Department (HOSPITAL_COMMUNITY)
Admission: EM | Admit: 2013-02-05 | Discharge: 2013-02-05 | Disposition: A | Discharge status: Home / Self Care | Payer: Medicaid Other | Attending: Emergency Medicine | Admitting: Emergency Medicine

## 2013-02-05 ENCOUNTER — Encounter (HOSPITAL_COMMUNITY): Payer: Self-pay | Admitting: Emergency Medicine

## 2013-02-05 DIAGNOSIS — L272 Dermatitis due to ingested food: Secondary | ICD-10-CM | POA: Insufficient documentation

## 2013-02-05 DIAGNOSIS — Z8619 Personal history of other infectious and parasitic diseases: Secondary | ICD-10-CM | POA: Insufficient documentation

## 2013-02-05 DIAGNOSIS — Z87891 Personal history of nicotine dependence: Secondary | ICD-10-CM | POA: Insufficient documentation

## 2013-02-05 DIAGNOSIS — T7840XA Allergy, unspecified, initial encounter: Secondary | ICD-10-CM

## 2013-02-05 DIAGNOSIS — R209 Unspecified disturbances of skin sensation: Secondary | ICD-10-CM | POA: Insufficient documentation

## 2013-02-05 DIAGNOSIS — G8929 Other chronic pain: Secondary | ICD-10-CM | POA: Insufficient documentation

## 2013-02-05 MED ORDER — DEXAMETHASONE SODIUM PHOSPHATE 10 MG/ML IJ SOLN
10.0000 mg | Freq: Once | INTRAMUSCULAR | Status: AC
  Administered 2013-02-05: 10 mg via INTRAMUSCULAR
  Filled 2013-02-05: qty 1

## 2013-02-05 NOTE — ED Provider Notes (Signed)
CSN: [id number]     Arrival date & time 02/05/13  1115 History  This chart was scribed for non-physician practitioner Mora Bellman, PA-C working with Derwood Kaplan, MD by Valera Castle, ED scribe. This patient was seen in room WTR7/WTR7 and the patient's care was started at 12:43 PM.    Chief Complaint  Patient presents with  . Oral Swelling  . Allergic Reaction    The history is provided by the patient. No language interpreter was used.   HPI Comments: Kelsey Lynch is a 27 y.o. female with an allergy to shellfish, who presents to the Emergency Department complaining of sudden, moderate, constant, upper lip swelling, with associated tingling, onset last night after eating. She denies any pain to the area, but states that when she is talking she feels like her lip is not moving. She reports that her food, wings and fries, might have been in proximity to shellfish. She reports taking 2 Benadryl last night and this morning, without relief. She states that usually the Benadryl relieves the symptoms. She denies fever, throat trouble, sensation that her throat is closing, SOB, rash, recent illness, nausea, emesis, and any other associated symptoms. She reports being a former smoker, and occasional EtOH use. She has a medical h/o headaches, chronic back pain, and herpes.    Past Medical History  Diagnosis Date  . Headache(784.0)   . Back pain, chronic   . STD (sexually transmitted disease)     herpes   History reviewed. No pertinent past surgical history. Family History  Problem Relation Age of Onset  . Diabetes Paternal Grandmother    History  Substance Use Topics  . Smoking status: Former Games developer  . Smokeless tobacco: Never Used  . Alcohol Use: Yes     Comment: occassionally   OB History   Grav Para Term Preterm Abortions TAB SAB Ect Mult Living   0              Review of Systems  Constitutional: Negative for fever and chills.  HENT: Positive for facial swelling (Lower lip).  Negative for trouble swallowing.   Respiratory: Negative for shortness of breath.   Gastrointestinal: Negative for nausea and vomiting.  Neurological:       Tingling to lower lip.  All other systems reviewed and are negative.    Allergies  Shellfish allergy  Home Medications   Current Outpatient Rx  Name  Route  Sig  Dispense  Refill  . aspirin 325 MG tablet   Oral   Take 325 mg by mouth every 4 (four) hours as needed (migraine).         . diphenhydrAMINE (BENADRYL) 25 MG tablet   Oral   Take 25 mg by mouth every 6 (six) hours as needed for itching or allergies.          Triage Vitals: BP 115/74  Pulse 59  Temp(Src) 98.8 F (37.1 C) (Oral)  Resp 20  SpO2 98%  LMP 01/09/2013  Physical Exam  Nursing note and vitals reviewed. Constitutional: She is oriented to person, place, and time. She appears well-developed and well-nourished. No distress.  HENT:  Head: Normocephalic and atraumatic.  Right Ear: External ear normal.  Left Ear: External ear normal.  Nose: Nose normal.  Mouth/Throat: Uvula is midline, oropharynx is clear and moist and mucous membranes are normal. No oral lesions. No trismus in the jaw. No uvula swelling.  Maintaining own secretions. No swelling to upper lip as per pt report.  Eyes:  Conjunctivae and EOM are normal.  Neck: Normal range of motion. Neck supple. No tracheal deviation present.  Cardiovascular: Normal rate, regular rhythm and normal heart sounds.   Pulmonary/Chest: Effort normal and breath sounds normal. No stridor. No respiratory distress. She has no wheezes. She has no rales.  Speaking easily in full sentences. Patient having conversation on phone when I walked into the room.   Abdominal: Soft. She exhibits no distension.  Musculoskeletal: Normal range of motion.  Neurological: She is alert and oriented to person, place, and time. She has normal strength.  Skin: Skin is warm and dry. She is not diaphoretic. No erythema.  Psychiatric:  She has a normal mood and affect. Her behavior is normal.    ED Course  Procedures (including critical care time)  DIAGNOSTIC STUDIES: Oxygen Saturation is 98% on room air, normal by my interpretation.    COORDINATION OF CARE: 12:46 PM-Discussed treatment plan which includes Decadron with pt at bedside and pt agreed to plan. Advised pt to f/u if the symptoms worsen.     Labs Review Labs Reviewed - No data to display Imaging Review No results found.  MDM   1. Allergic reaction, initial encounter    Patient evaluated prior to dc, is hemodynamically stable, in no respiratory distress, and denies the feeling of throat closing. Pt has been advised to take OTC benadryl & return to the ED if they have a mod-severe allergic rxn (s/s including throat closing, difficulty breathing, swelling of lips face or tongue). Pt is to follow up with their PCP. Pt is agreeable with plan & verbalizes understanding.    I personally performed the services described in this documentation, which was scribed in my presence. The recorded information has been reviewed and is accurate.     Mora Bellman, PA-C 02/05/13 1319

## 2013-02-05 NOTE — ED Notes (Signed)
Pt c/o lower lip swelling and tingling after eating last night.  Denies pain.  Denies difficulty breathing or swallowing.  Sts "my food must have touched some shellfish."  Sts took Benadryl last night and this am w/o relief.

## 2013-02-10 NOTE — ED Provider Notes (Signed)
Medical screening examination/treatment/procedure(s) were performed by non-physician practitioner and as supervising physician I was immediately available for consultation/collaboration.  Derwood Kaplan, MD 02/10/13 [phone]

## 2013-02-13 ENCOUNTER — Emergency Department (HOSPITAL_BASED_OUTPATIENT_CLINIC_OR_DEPARTMENT_OTHER)
Admission: EM | Admit: 2013-02-13 | Discharge: 2013-02-13 | Disposition: A | Discharge status: Home / Self Care | Payer: Medicaid Other | Attending: Emergency Medicine | Admitting: Emergency Medicine

## 2013-02-13 ENCOUNTER — Encounter (HOSPITAL_BASED_OUTPATIENT_CLINIC_OR_DEPARTMENT_OTHER): Payer: Self-pay | Admitting: Emergency Medicine

## 2013-02-13 DIAGNOSIS — J069 Acute upper respiratory infection, unspecified: Secondary | ICD-10-CM | POA: Insufficient documentation

## 2013-02-13 DIAGNOSIS — G8929 Other chronic pain: Secondary | ICD-10-CM | POA: Insufficient documentation

## 2013-02-13 DIAGNOSIS — Z87891 Personal history of nicotine dependence: Secondary | ICD-10-CM | POA: Insufficient documentation

## 2013-02-13 DIAGNOSIS — Z8619 Personal history of other infectious and parasitic diseases: Secondary | ICD-10-CM | POA: Insufficient documentation

## 2013-02-13 MED ORDER — ALBUTEROL SULFATE HFA 108 (90 BASE) MCG/ACT IN AERS: 2.0000 | INHALATION_SPRAY | RESPIRATORY_TRACT | Status: DC | PRN

## 2013-02-13 MED ORDER — HYDROCOD POLST-CHLORPHEN POLST 10-8 MG/5ML PO LQCR: 5.0000 mL | Freq: Two times a day (BID) | ORAL | Status: DC | PRN

## 2013-02-13 MED ORDER — PREDNISONE 20 MG PO TABS: 40.0000 mg | ORAL_TABLET | Freq: Every day | ORAL | Status: DC

## 2013-02-13 NOTE — ED Provider Notes (Signed)
CSN: [id number]     Arrival date & time 02/13/13  1111 History   First MD Initiated Contact with Patient 02/13/13 1119     Chief Complaint  Patient presents with  . URI   (Consider location/radiation/quality/duration/timing/severity/associated sxs/prior Treatment) HPI Comments: Patient presents to the ER for evaluation of cough. Patient has been experiencing symptoms for approximately 5 days. She started with a sore throat, now her voice is worse. Cough has worsened, nonproductive. She has been experiencing fever. No nausea vomiting or diarrhea.  Patient is a 27 y.o. female presenting with URI.  URI Presenting symptoms: cough and sore throat     Past Medical History  Diagnosis Date  . Headache(784.0)   . Back pain, chronic   . STD (sexually transmitted disease)     herpes   History reviewed. No pertinent past surgical history. Family History  Problem Relation Age of Onset  . Diabetes Paternal Grandmother    History  Substance Use Topics  . Smoking status: Former Games developer  . Smokeless tobacco: Never Used  . Alcohol Use: Yes     Comment: occassionally   OB History   Grav Para Term Preterm Abortions TAB SAB Ect Mult Living   0              Review of Systems  HENT: Positive for sore throat and voice change.   Respiratory: Positive for cough.   All other systems reviewed and are negative.    Allergies  Shellfish allergy  Home Medications   Current Outpatient Rx  Name  Route  Sig  Dispense  Refill  . aspirin 325 MG tablet   Oral   Take 325 mg by mouth every 4 (four) hours as needed (migraine).         . diphenhydrAMINE (BENADRYL) 25 MG tablet   Oral   Take 25 mg by mouth every 6 (six) hours as needed for itching or allergies.          BP 115/78  Pulse 70  Temp(Src) 98 F (36.7 C) (Oral)  Resp 16  Wt 129 lb (58.514 kg)  BMI 23.59 kg/m2  SpO2 100%  LMP 01/09/2013 Physical Exam  Constitutional: She is oriented to person, place, and time. She appears  well-developed and well-nourished. No distress.  HENT:  Head: Normocephalic and atraumatic.  Right Ear: Hearing normal.  Left Ear: Hearing normal.  Nose: Nose normal.  Mouth/Throat: Oropharynx is clear and moist and mucous membranes are normal.  Eyes: Conjunctivae and EOM are normal. Pupils are equal, round, and reactive to light.  Neck: Normal range of motion. Neck supple.  Cardiovascular: Regular rhythm, S1 normal and S2 normal.  Exam reveals no gallop and no friction rub.   No murmur heard. Pulmonary/Chest: Effort normal and breath sounds normal. No respiratory distress. She exhibits no tenderness.  Abdominal: Soft. Normal appearance and bowel sounds are normal. There is no hepatosplenomegaly. There is no tenderness. There is no rebound, no guarding, no tenderness at McBurney's point and negative Murphy's sign. No hernia.  Musculoskeletal: Normal range of motion.  Neurological: She is alert and oriented to person, place, and time. She has normal strength. No cranial nerve deficit or sensory deficit. Coordination normal. GCS eye subscore is 4. GCS verbal subscore is 5. GCS motor subscore is 6.  Skin: Skin is warm, dry and intact. No rash noted. No cyanosis.  Psychiatric: She has a normal mood and affect. Her speech is normal and behavior is normal. Thought content normal.  ED Course  Procedures (including critical care time) Labs Review Labs Reviewed - No data to display Imaging Review No results found.  EKG Interpretation   None       MDM  Diagnosis: Upper respiratory infection  Patient presents to the ER for evaluation of respiratory infection symptoms. Patient has persistent cough which is nonproductive. Lungs are clear, no clinical concern for pneumonia. She is afebrile. Oxygen saturation 100% on room air. She is complaining of sore throat, oropharyngeal examination is normal. She has a hoarse voice consistent with a viral illness. Patient will be treated  symptomatically.    Gilda Crease, MD 02/13/13 1125

## 2013-02-13 NOTE — ED Notes (Signed)
Patient has had cough since Friday. Sore throat and ear pain

## 2013-10-29 ENCOUNTER — Telehealth (HOSPITAL_COMMUNITY): Payer: Self-pay | Admitting: *Deleted

## 2013-10-29 NOTE — Telephone Encounter (Signed)
Telephoned patient at home # and left message to return call to [facility] 

## 2013-11-14 ENCOUNTER — Encounter (HOSPITAL_COMMUNITY): Payer: Self-pay | Admitting: Emergency Medicine

## 2013-11-14 ENCOUNTER — Emergency Department (HOSPITAL_COMMUNITY)
Admission: EM | Admit: 2013-11-14 | Discharge: 2013-11-14 | Disposition: A | Discharge status: Home / Self Care | Payer: Medicaid Other | Attending: Emergency Medicine | Admitting: Emergency Medicine

## 2013-11-14 DIAGNOSIS — G8929 Other chronic pain: Secondary | ICD-10-CM | POA: Insufficient documentation

## 2013-11-14 DIAGNOSIS — R112 Nausea with vomiting, unspecified: Secondary | ICD-10-CM | POA: Insufficient documentation

## 2013-11-14 DIAGNOSIS — Z79899 Other long term (current) drug therapy: Secondary | ICD-10-CM | POA: Insufficient documentation

## 2013-11-14 DIAGNOSIS — A64 Unspecified sexually transmitted disease: Secondary | ICD-10-CM | POA: Insufficient documentation

## 2013-11-14 DIAGNOSIS — M549 Dorsalgia, unspecified: Secondary | ICD-10-CM | POA: Insufficient documentation

## 2013-11-14 DIAGNOSIS — Z87891 Personal history of nicotine dependence: Secondary | ICD-10-CM | POA: Diagnosis not present

## 2013-11-14 DIAGNOSIS — G43901 Migraine, unspecified, not intractable, with status migrainosus: Secondary | ICD-10-CM

## 2013-11-14 DIAGNOSIS — G43911 Migraine, unspecified, intractable, with status migrainosus: Secondary | ICD-10-CM | POA: Diagnosis not present

## 2013-11-14 MED ORDER — PROCHLORPERAZINE EDISYLATE 5 MG/ML IJ SOLN
10.0000 mg | Freq: Once | INTRAMUSCULAR | Status: AC
  Administered 2013-11-14: 10 mg via INTRAVENOUS
  Filled 2013-11-14: qty 2

## 2013-11-14 MED ORDER — DEXAMETHASONE SODIUM PHOSPHATE 10 MG/ML IJ SOLN
10.0000 mg | Freq: Once | INTRAMUSCULAR | Status: AC
  Administered 2013-11-14: 10 mg via INTRAVENOUS
  Filled 2013-11-14: qty 1

## 2013-11-14 MED ORDER — SODIUM CHLORIDE 0.9 % IV SOLN
INTRAVENOUS | Status: DC
  Administered 2013-11-14: 09:00:00 via INTRAVENOUS

## 2013-11-14 MED ORDER — DIPHENHYDRAMINE HCL 50 MG/ML IJ SOLN
25.0000 mg | Freq: Once | INTRAMUSCULAR | Status: AC
  Administered 2013-11-14: 25 mg via INTRAVENOUS
  Filled 2013-11-14: qty 1

## 2013-11-14 MED ORDER — SUMATRIPTAN SUCCINATE 50 MG PO TABS: 50.0000 mg | ORAL_TABLET | ORAL | Status: DC | PRN

## 2013-11-14 MED ORDER — KETOROLAC TROMETHAMINE 30 MG/ML IJ SOLN
30.0000 mg | Freq: Once | INTRAMUSCULAR | Status: AC
  Administered 2013-11-14: 30 mg via INTRAVENOUS
  Filled 2013-11-14: qty 1

## 2013-11-14 NOTE — ED Provider Notes (Signed)
CSN: [id number]     Arrival date & time 11/14/13  0807 History   First MD Initiated Contact with Patient 11/14/13 [phone]     Chief Complaint  Patient presents with  . Migraine  . Emesis    HPI Pt has a history of migraines.  For the last few weeks they have become more frequent.  Headache is in the temples and top of her head.  Feels like she is being punched in the head.  She has felt nauseated.  Vomited twice this am when it became more severe again.  Takes OTC meds for her headache.   Does not see a doctor.  No trauma.  No fever.  No abdominal pain.  No numbness or weakness. Past Medical History  Diagnosis Date  . Headache(784.0)   . Back pain, chronic   . STD (sexually transmitted disease)     herpes   History reviewed. No pertinent past surgical history. Family History  Problem Relation Age of Onset  . Diabetes Paternal Grandmother    History  Substance Use Topics  . Smoking status: Former Games developer  . Smokeless tobacco: Never Used  . Alcohol Use: No     Comment: Has stopped for multiple months   OB History   Grav Para Term Preterm Abortions TAB SAB Ect Mult Living   0              Review of Systems  All other systems reviewed and are negative.     Allergies  Shellfish allergy  Home Medications   Prior to Admission medications   Medication Sig Start Date End Date Taking? Authorizing Provider  diphenhydrAMINE (BENADRYL) 25 MG tablet Take 25 mg by mouth every 6 (six) hours as needed for itching or allergies.   Yes Historical Provider, MD  SUMAtriptan (IMITREX) 50 MG tablet Take 1 tablet (50 mg total) by mouth every 2 (two) hours as needed for migraine or headache ((max 4 tabs per 24 hours)). May repeat in 2 hours if headache persists or recurs. 11/14/13   Linwood Dibbles, MD   BP 108/65  Pulse 72  Temp(Src) 98.1 F (36.7 C) (Oral)  Resp 16  Ht 5\' 2"  (1.575 m)  Wt 123 lb 4 oz (55.906 kg)  BMI 22.54 kg/m2  SpO2 100%  LMP 10/21/2013 Physical Exam  Nursing note and  vitals reviewed. Constitutional: She is oriented to person, place, and time. She appears well-developed and well-nourished. No distress.  HENT:  Head: Normocephalic and atraumatic.  Right Ear: External ear normal.  Left Ear: External ear normal.  Mouth/Throat: Oropharynx is clear and moist.  Eyes: Conjunctivae are normal. Right eye exhibits no discharge. Left eye exhibits no discharge. No scleral icterus.  Neck: Normal range of motion and full passive range of motion without pain. Neck supple. No tracheal deviation present.  Cardiovascular: Normal rate, regular rhythm and intact distal pulses.   Pulmonary/Chest: Effort normal and breath sounds normal. No stridor. No respiratory distress. She has no wheezes. She has no rales.  Abdominal: Soft. Bowel sounds are normal. She exhibits no distension. There is no tenderness. There is no rebound and no guarding.  Musculoskeletal: She exhibits no edema and no tenderness.  Neurological: She is alert and oriented to person, place, and time. She has normal strength. No cranial nerve deficit (no facial droop, extraocular movements intact, no slurred speech) or sensory deficit. She exhibits normal muscle tone. She displays no seizure activity. Coordination normal.  5/5 strength bilateral UE and LE, sensation  intact in all extremities, no visual field cuts, no left or right sided neglect,  no nystagmus noted   Skin: Skin is warm and dry. No rash noted.  Psychiatric: She has a normal mood and affect.    ED Course  Procedures (including critical care time)  Medications  0.9 %  sodium chloride infusion ( Intravenous Stopped 11/14/13 0934)  prochlorperazine (COMPAZINE) injection 10 mg (10 mg Intravenous Given 11/14/13 0842)  diphenhydrAMINE (BENADRYL) injection 25 mg (25 mg Intravenous Given 11/14/13 0842)  dexamethasone (DECADRON) injection 10 mg (10 mg Intravenous Given 11/14/13 0842)  ketorolac (TORADOL) 30 MG/ML injection 30 mg (30 mg Intravenous Given  11/14/13 0954)   1020  Pt sleeping right now. 1106  Feeling better.  Headache not completely resolved but feels well enough to go home.  Would like to go home and rest. MDM   Final diagnoses:  Migraine with status migrainosus, not intractable, unspecified migraine type   Consistent with migraine.  Normal neuro exam.  Doubt stroke, tumor.  No sudden onset.  Doubt SAH.  Discussed outpatient treatment.  Follow up with  A PCP  Rx  imitrex     Linwood DibblesJon Shishir Krantz, MD 11/14/13 [phone]

## 2013-11-14 NOTE — Discharge Instructions (Signed)
Headaches, Frequently Asked Questions  MIGRAINE HEADACHES  Q: What is migraine? What causes it? How can I treat it?  A: Generally, migraine headaches begin as a dull ache. Then they develop into a constant, throbbing, and pulsating pain. You may experience pain at the temples. You may experience pain at the front or back of one or both sides of the head. The pain is usually accompanied by a combination of:  · Nausea.  · Vomiting.  · Sensitivity to light and noise.  Some people (about 15%) experience an aura (see below) before an attack. The cause of migraine is believed to be chemical reactions in the brain. Treatment for migraine may include over-the-counter or prescription medications. It may also include self-help techniques. These include relaxation training and biofeedback.   Q: What is an aura?  A: About 15% of people with migraine get an "aura". This is a sign of neurological symptoms that occur before a migraine headache. You may see wavy or jagged lines, dots, or flashing lights. You might experience tunnel vision or blind spots in one or both eyes. The aura can include visual or auditory hallucinations (something imagined). It may include disruptions in smell (such as strange odors), taste or touch. Other symptoms include:  · Numbness.  · A "pins and needles" sensation.  · Difficulty in recalling or speaking the correct word.  These neurological events may last as long as 60 minutes. These symptoms will fade as the headache begins.  Q: What is a trigger?  A: Certain physical or environmental factors can lead to or "trigger" a migraine. These include:  · Foods.  · Hormonal changes.  · Weather.  · Stress.  It is important to remember that triggers are different for everyone. To help prevent migraine attacks, you need to figure out which triggers affect you. Keep a headache diary. This is a good way to track triggers. The diary will help you talk to your healthcare professional about your condition.  Q: Does  weather affect migraines?  A: Bright sunshine, hot, humid conditions, and drastic changes in barometric pressure may lead to, or "trigger," a migraine attack in some people. But studies have shown that weather does not act as a trigger for everyone with migraines.  Q: What is the link between migraine and hormones?  A: Hormones start and regulate many of your body's functions. Hormones keep your body in balance within a constantly changing environment. The levels of hormones in your body are unbalanced at times. Examples are during menstruation, pregnancy, or menopause. That can lead to a migraine attack. In fact, about three quarters of all women with migraine report that their attacks are related to the menstrual cycle.   Q: Is there an increased risk of stroke for migraine sufferers?  A: The likelihood of a migraine attack causing a stroke is very remote. That is not to say that migraine sufferers cannot have a stroke associated with their migraines. In persons under age 40, the most common associated factor for stroke is migraine headache. But over the course of a person's normal life span, the occurrence of migraine headache may actually be associated with a reduced risk of dying from cerebrovascular disease due to stroke.   Q: What are acute medications for migraine?  A: Acute medications are used to treat the pain of the headache after it has started. Examples over-the-counter medications, NSAIDs, ergots, and triptans.   Q: What are the triptans?  A: Triptans are the newest class of   abortive medications. They are specifically targeted to treat migraine. Triptans are vasoconstrictors. They moderate some chemical reactions in the brain. The triptans work on receptors in your brain. Triptans help to restore the balance of a neurotransmitter called serotonin. Fluctuations in levels of serotonin are thought to be a main cause of migraine.   Q: Are over-the-counter medications for migraine effective?  A:  Over-the-counter, or "OTC," medications may be effective in relieving mild to moderate pain and associated symptoms of migraine. But you should see your caregiver before beginning any treatment regimen for migraine.   Q: What are preventive medications for migraine?  A: Preventive medications for migraine are sometimes referred to as "prophylactic" treatments. They are used to reduce the frequency, severity, and length of migraine attacks. Examples of preventive medications include antiepileptic medications, antidepressants, beta-blockers, calcium channel blockers, and NSAIDs (nonsteroidal anti-inflammatory drugs).  Q: Why are anticonvulsants used to treat migraine?  A: During the past few years, there has been an increased interest in antiepileptic drugs for the prevention of migraine. They are sometimes referred to as "anticonvulsants". Both epilepsy and migraine may be caused by similar reactions in the brain.   Q: Why are antidepressants used to treat migraine?  A: Antidepressants are typically used to treat people with depression. They may reduce migraine frequency by regulating chemical levels, such as serotonin, in the brain.   Q: What alternative therapies are used to treat migraine?  A: The term "alternative therapies" is often used to describe treatments considered outside the scope of conventional Western medicine. Examples of alternative therapy include acupuncture, acupressure, and yoga. Another common alternative treatment is herbal therapy. Some herbs are believed to relieve headache pain. Always discuss alternative therapies with your caregiver before proceeding. Some herbal products contain arsenic and other toxins.  TENSION HEADACHES  Q: What is a tension-type headache? What causes it? How can I treat it?  A: Tension-type headaches occur randomly. They are often the result of temporary stress, anxiety, fatigue, or anger. Symptoms include soreness in your temples, a tightening band-like sensation  around your head (a "vice-like" ache). Symptoms can also include a pulling feeling, pressure sensations, and contracting head and neck muscles. The headache begins in your forehead, temples, or the back of your head and neck. Treatment for tension-type headache may include over-the-counter or prescription medications. Treatment may also include self-help techniques such as relaxation training and biofeedback.  CLUSTER HEADACHES  Q: What is a cluster headache? What causes it? How can I treat it?  A: Cluster headache gets its name because the attacks come in groups. The pain arrives with little, if any, warning. It is usually on one side of the head. A tearing or bloodshot eye and a runny nose on the same side of the headache may also accompany the pain. Cluster headaches are believed to be caused by chemical reactions in the brain. They have been described as the most severe and intense of any headache type. Treatment for cluster headache includes prescription medication and oxygen.  SINUS HEADACHES  Q: What is a sinus headache? What causes it? How can I treat it?  A: When a cavity in the bones of the face and skull (a sinus) becomes inflamed, the inflammation will cause localized pain. This condition is usually the result of an allergic reaction, a tumor, or an infection. If your headache is caused by a sinus blockage, such as an infection, you will probably have a fever. An x-ray will confirm a sinus blockage. Your caregiver's   treatment might include antibiotics for the infection, as well as antihistamines or decongestants.   REBOUND HEADACHES  Q: What is a rebound headache? What causes it? How can I treat it?  A: A pattern of taking acute headache medications too often can lead to a condition known as "rebound headache." A pattern of taking too much headache medication includes taking it more than 2 days per week or in excessive amounts. That means more than the label or a caregiver advises. With rebound  headaches, your medications not only stop relieving pain, they actually begin to cause headaches. Doctors treat rebound headache by tapering the medication that is being overused. Sometimes your caregiver will gradually substitute a different type of treatment or medication. Stopping may be a challenge. Regularly overusing a medication increases the potential for serious side effects. Consult a caregiver if you regularly use headache medications more than 2 days per week or more than the label advises.  ADDITIONAL QUESTIONS AND ANSWERS  Q: What is biofeedback?  A: Biofeedback is a self-help treatment. Biofeedback uses special equipment to monitor your body's involuntary physical responses. Biofeedback monitors:  · Breathing.  · Pulse.  · Heart rate.  · Temperature.  · Muscle tension.  · Brain activity.  Biofeedback helps you refine and perfect your relaxation exercises. You learn to control the physical responses that are related to stress. Once the technique has been mastered, you do not need the equipment any more.  Q: Are headaches hereditary?  A: Four out of five (80%) of people that suffer report a family history of migraine. Scientists are not sure if this is genetic or a family predisposition. Despite the uncertainty, a child has a 50% chance of having migraine if one parent suffers. The child has a 75% chance if both parents suffer.   Q: Can children get headaches?  A: By the time they reach high school, most young people have experienced some type of headache. Many safe and effective approaches or medications can prevent a headache from occurring or stop it after it has begun.   Q: What type of doctor should I see to diagnose and treat my headache?  A: Start with your primary caregiver. Discuss his or her experience and approach to headaches. Discuss methods of classification, diagnosis, and treatment. Your caregiver may decide to recommend you to a headache specialist, depending upon your symptoms or other  physical conditions. Having diabetes, allergies, etc., may require a more comprehensive and inclusive approach to your headache. The [employer] will provide, upon request, a list of NHF physician members in your state.  Document Released: 07/08/2003 Document Revised: 07/10/2011 Document Reviewed: 12/16/2007  ExitCare® Patient Information ©2015 [employer]. This information is not intended to replace advice given to you by your health care provider. Make sure you discuss any questions you have with your health care provider.      Migraine Headache  A migraine headache is very bad, throbbing pain on one or both sides of your head. Talk to your doctor about what things may bring on (trigger) your migraine headaches.  HOME CARE  · Only take medicines as told by your doctor.  · Lie down in a dark, quiet room when you have a migraine.  · Keep a journal to find out if certain things bring on migraine headaches. For example, write down:  ¨ What you eat and drink.  ¨ How much sleep you get.  ¨ Any change to your diet or medicines.  ·   Lessen how much alcohol you drink.  · Quit smoking if you smoke.  · Get enough sleep.  · Lessen any stress in your life.  · Keep lights dim if bright lights bother you or make your migraines worse.  GET HELP RIGHT AWAY IF:   · Your migraine becomes really bad.  · You have a fever.  · You have a stiff neck.  · You have trouble seeing.  · Your muscles are weak, or you lose muscle control.  · You lose your balance or have trouble walking.  · You feel like you will pass out (faint), or you pass out.  · You have really bad symptoms that are different than your first symptoms.  MAKE SURE YOU:   · Understand these instructions.  · Will watch your condition.  · Will get help right away if you are not doing well or get worse.  Document Released: 01/25/2008 Document Revised: 07/10/2011 Document Reviewed: 12/23/2012  ExitCare® Patient Information ©2015 [employer]. This information is  not intended to replace advice given to you by your health care provider. Make sure you discuss any questions you have with your health care provider.

## 2013-11-14 NOTE — Progress Notes (Signed)
[facility] provided pt with a list of primary care resources to help patient establish a pcp.  °

## 2013-11-14 NOTE — ED Notes (Signed)
Per pt, has hx of migraines off and on.  States headache for past 3 weeks with difficulty eating.  States for last two days has only eaten french fries or been able to drink water.  No sinus congestion, abdominal pain or any other symptom.  No fever.

## 2013-11-22 ENCOUNTER — Emergency Department (HOSPITAL_COMMUNITY)
Admission: EM | Admit: 2013-11-22 | Discharge: 2013-11-22 | Disposition: A | Discharge status: Home / Self Care | Payer: Medicaid Other | Attending: Emergency Medicine | Admitting: Emergency Medicine

## 2013-11-22 ENCOUNTER — Encounter (HOSPITAL_COMMUNITY): Payer: Self-pay | Admitting: Emergency Medicine

## 2013-11-22 DIAGNOSIS — O219 Vomiting of pregnancy, unspecified: Secondary | ICD-10-CM | POA: Insufficient documentation

## 2013-11-22 DIAGNOSIS — G8929 Other chronic pain: Secondary | ICD-10-CM | POA: Insufficient documentation

## 2013-11-22 DIAGNOSIS — Z8619 Personal history of other infectious and parasitic diseases: Secondary | ICD-10-CM | POA: Insufficient documentation

## 2013-11-22 DIAGNOSIS — Z3490 Encounter for supervision of normal pregnancy, unspecified, unspecified trimester: Secondary | ICD-10-CM

## 2013-11-22 DIAGNOSIS — Z87891 Personal history of nicotine dependence: Secondary | ICD-10-CM | POA: Insufficient documentation

## 2013-11-22 DIAGNOSIS — R112 Nausea with vomiting, unspecified: Secondary | ICD-10-CM

## 2013-11-22 LAB — COMPREHENSIVE METABOLIC PANEL
ALT: 13 U/L (ref 0–35)
AST: 15 U/L (ref 0–37)
Albumin: 4 g/dL (ref 3.5–5.2)
Alkaline Phosphatase: 46 U/L (ref 39–117)
Anion gap: 12 (ref 5–15)
BUN: 10 mg/dL (ref 6–23)
CO2: 23 mEq/L (ref 19–32)
Calcium: 9.2 mg/dL (ref 8.4–10.5)
Chloride: 104 mEq/L (ref 96–112)
Creatinine, Ser: 0.64 mg/dL (ref 0.50–1.10)
GFR calc Af Amer: >90 mL/min (ref >90)
GFR calc non Af Amer: >90 mL/min (ref >90)
Glucose, Bld: 87 mg/dL (ref 70–99)
Potassium: 3.8 mEq/L (ref 3.7–5.3)
Sodium: 139 mEq/L (ref 137–147)
Total Bilirubin: 0.5 mg/dL (ref 0.3–1.2)
Total Protein: 7.3 g/dL (ref 6.0–8.3)

## 2013-11-22 LAB — URINALYSIS, ROUTINE W REFLEX MICROSCOPIC
Bilirubin Urine: NEGATIVE
Glucose, UA: NEGATIVE mg/dL
Hgb urine dipstick: NEGATIVE
Ketones, ur: NEGATIVE mg/dL
Leukocytes, UA: NEGATIVE
Nitrite: NEGATIVE
Protein, ur: NEGATIVE mg/dL
Specific Gravity, Urine: 1.022 (ref 1.005–1.030)
Urobilinogen, UA: 0.2 mg/dL (ref 0.0–1.0)
pH: 8 (ref 5.0–8.0)

## 2013-11-22 LAB — CBC WITH DIFFERENTIAL/PLATELET
Basophils Absolute: 0 10*3/uL (ref 0.0–0.1)
Basophils Relative: 0 % (ref 0–1)
Eosinophils Absolute: 0.1 10*3/uL (ref 0.0–0.7)
Eosinophils Relative: 1 % (ref 0–5)
HCT: 38.8 % (ref 36.0–46.0)
Hemoglobin: 12.7 g/dL (ref 12.0–15.0)
Lymphocytes Relative: 11 % — ABNORMAL LOW (ref 12–46)
Lymphs Abs: 1.1 10*3/uL (ref 0.7–4.0)
MCH: 24.7 pg — ABNORMAL LOW (ref 26.0–34.0)
MCHC: 32.7 g/dL (ref 30.0–36.0)
MCV: 75.3 fL — ABNORMAL LOW (ref 78.0–100.0)
Monocytes Absolute: 0.6 10*3/uL (ref 0.1–1.0)
Monocytes Relative: 6 % (ref 3–12)
Neutro Abs: 8.3 10*3/uL — ABNORMAL HIGH (ref 1.7–7.7)
Neutrophils Relative %: 82 % — ABNORMAL HIGH (ref 43–77)
Platelets: 253 10*3/uL (ref 150–400)
RBC: 5.15 MIL/uL — ABNORMAL HIGH (ref 3.87–5.11)
RDW: 15.3 % (ref 11.5–15.5)
WBC: 10.1 10*3/uL (ref 4.0–10.5)

## 2013-11-22 LAB — POC URINE PREG, ED: Preg Test, Ur: POSITIVE — AB

## 2013-11-22 MED ORDER — ONDANSETRON HCL 4 MG/2ML IJ SOLN
4.0000 mg | Freq: Once | INTRAMUSCULAR | Status: AC
  Administered 2013-11-22: 4 mg via INTRAVENOUS
  Filled 2013-11-22: qty 2

## 2013-11-22 MED ORDER — ONDANSETRON HCL 4 MG PO TABS: 4.0000 mg | ORAL_TABLET | Freq: Four times a day (QID) | ORAL | Status: DC

## 2013-11-22 NOTE — ED Notes (Signed)
Per pt, states started vomiting X 5 after she ate lunch and hasn't stopped-no abdominal pain

## 2013-11-22 NOTE — Discharge Instructions (Signed)
First Trimester of Pregnancy °The first trimester of pregnancy is from week 1 until the end of week 12 (months 1 through 3). During this time, your baby will begin to develop inside you. At 6-8 weeks, the eyes and face are formed, and the heartbeat can be seen on ultrasound. At the end of 12 weeks, all the baby's organs are formed. Prenatal care is all the medical care you receive before the birth of your baby. Make sure you get good prenatal care and follow all of your doctor's instructions. °HOME CARE  °Medicines °· Take medicine only as told by your doctor. Some medicines are safe and some are not during pregnancy. °· Take your prenatal vitamins as told by your doctor. °· Take medicine that helps you poop (stool softener) as needed if your doctor says it is okay. °Diet °· Eat regular, healthy meals. °· Your doctor will tell you the amount of weight gain that is right for you. °· Avoid raw meat and uncooked cheese. °· If you feel sick to your stomach (nauseous) or throw up (vomit): °¨ Eat 4 or 5 small meals a day instead of 3 large meals. °¨ Try eating a few soda crackers. °¨ Drink liquids between meals instead of during meals. °· If you have a hard time pooping (constipation): °¨ Eat high-fiber foods like fresh vegetables, fruit, and whole grains. °¨ Drink enough fluids to keep your pee (urine) clear or pale yellow. °Activity and Exercise °· Exercise only as told by your doctor. Stop exercising if you have cramps or pain in your lower belly (abdomen) or low back. °· Try to avoid standing for long periods of time. Move your legs often if you must stand in one place for a long time. °· Avoid heavy lifting. °· Wear low-heeled shoes. Sit and stand up straight. °· You can have sex unless your doctor tells you not to. °Relief of Pain or Discomfort °· Wear a good support bra if your breasts are sore. °· Take warm water baths (sitz baths) to soothe pain or discomfort caused by hemorrhoids. Use hemorrhoid cream if your  doctor says it is okay. °· Rest with your legs raised if you have leg cramps or low back pain. °· Wear support hose if you have puffy, bulging veins (varicose veins) in your legs. Raise (elevate) your feet for 15 minutes, 3-4 times a day. Limit salt in your diet. °Prenatal Care °· Schedule your prenatal visits by the twelfth week of pregnancy. °· Write down your questions. Take them to your prenatal visits. °· Keep all your prenatal visits as told by your doctor. °Safety °· Wear your seat belt at all times when driving. °· Make a list of emergency phone numbers. The list should include numbers for family, friends, the hospital, and police and fire departments. °General Tips °· Ask your doctor for a referral to a local prenatal class. Begin classes no later than at the start of month 6 of your pregnancy. °· Ask for help if you need counseling or help with nutrition. Your doctor can give you advice or tell you where to go for help. °· Do not use hot tubs, steam rooms, or saunas. °· Do not douche or use tampons or scented sanitary pads. °· Do not cross your legs for long periods of time. °· Avoid litter boxes and soil used by cats. °· Avoid all smoking, herbs, and alcohol. Avoid drugs not approved by your doctor. °· Visit your dentist. At home, brush your teeth   with a soft toothbrush. Be gentle when you floss. °GET HELP IF: °· You are dizzy. °· You have mild cramps or pressure in your lower belly. °· You have a nagging pain in your belly area. °· You continue to feel sick to your stomach, throw up, or have watery poop (diarrhea). °· You have a bad smelling fluid coming from your vagina. °· You have pain with peeing (urination). °· You have increased puffiness (swelling) in your face, hands, legs, or ankles. °GET HELP RIGHT AWAY IF:  °· You have a fever. °· You are leaking fluid from your vagina. °· You have spotting or bleeding from your vagina. °· You have very bad belly cramping or pain. °· You gain or lose weight  rapidly. °· You throw up blood. It may look like coffee grounds. °· You are around people who have German measles, fifth disease, or chickenpox. °· You have a very bad headache. °· You have shortness of breath. °· You have any kind of trauma, such as from a fall or a car accident. °Document Released: 10/04/2007 Document Revised: 09/01/2013 Document Reviewed: 02/25/2013 °ExitCare® Patient Information ©2015 [employer]. This information is not intended to replace advice given to you by your health care provider. Make sure you discuss any questions you have with your health care provider. ° °Emergency Department Resource Guide °1) Find a Doctor and Pay Out of Pocket °Although you won't have to find out who is covered by your insurance plan, it is a good idea to ask around and get recommendations. You will then need to call the office and see if the doctor you have chosen will accept you as a new patient and what types of options they offer for patients who are self-pay. Some doctors offer discounts or will set up payment plans for their patients who do not have insurance, but you will need to ask so you aren't surprised when you get to your appointment. ° °2) Contact Your Local Health Department °Not all health departments have doctors that can see patients for sick visits, but many do, so it is worth a call to see if yours does. If you don't know where your local health department is, you can check in your phone book. The CDC also has a tool to help you locate your state's health department, and many state websites also have listings of all of their local health departments. ° °3) Find a Walk-in Clinic °If your illness is not likely to be very severe or complicated, you may want to try a walk in clinic. These are popping up all over the country in pharmacies, drugstores, and shopping centers. They're usually staffed by nurse practitioners or physician assistants that have been trained to treat common illnesses and  complaints. They're usually fairly quick and inexpensive. However, if you have serious medical issues or chronic medical problems, these are probably not your best option. ° °No Primary Care Doctor: °- Call [facility] at  832-8000 - they can help you locate a primary care doctor that  accepts your insurance, provides certain services, etc. °- Physician Referral Service- 1-800-533-3463 ° °Chronic Pain Problems: °Organization         Address  Phone   Notes  °[facility]  (336) 297-2271 Patients need to be referred by their primary care doctor.  ° °Medication Assistance: °Organization         Address  Phone   Notes  °[place] [facility] [address] °[place], [place] [address] (336) 641-8030 --Must be a resident of [place] °-- Must have NO insurance coverage whatsoever (no Medicaid/ Medicare, etc.) °-- The pt. MUST have a primary care doctor that directs their care regularly and follows them in the community °  °[facility]  (866) 331-1348   °[employer]  (888) 892-1162   ° °Agencies that provide inexpensive medical care: °Organization         Address  Phone   Notes  °[facility]  (336) 832-8035   °[facility]    (336) 832-7272   °[facility] [address] °[place], [place] [address] (336) 832-4777   °[facility] [address], °[place] (336) 271-4999   °[facility]    (336) 373-0678   °[facility]    (336) 272-1050   °[facility] ° [address], [place] Phone:  (336) 832-4444, Fax:  (336) 832-4440 Hours of Operation:  9 am - 6 pm, M-F.  Also accepts Medicaid/Medicare and self-pay.  °[facility] ° [address], [place] Phone: (336) 832-3150, Fax: (336) 832-3151. Hours of Operation:  8:30 am - 5:30 pm, M-F.  Also accepts Medicaid and self-pay.  °[facility] [place] [address], [place] Phone: (336) 878-6027   °[facility] [address], [place], [place] (336)723-1848, Ext. 123 Mondays & Thursdays: 7-9 AM.  First 15 patients are seen on a first come, first serve basis. °  ° °Medicaid-accepting [place] Providers: ° °Organization         Address  Phone   Notes  °[facility] [address], [place] (336) 641-2100 Also accepts self-pay patients.  °[facility] [address], [place] ° (336) 856-9996   °[facility] [address], [place] (336) 288-8857   °[facility] [address] [place] [address], [place] (336) 299-7000   °Veita Bland [address], [place]  ° (336) 373-1557 Only accepts [place] Access Medicaid patients after they have their name applied to their card.  ° °Self-Pay (no insurance) in [place]: ° °Organization         Address  Phone   Notes  °Sickle Cell Patients, [facility] [address], [place] (336) 832-1970   °[facility] [address], [place] (336) 832-4400   °[facility] ° [address], [place] (336) 992-4800   °[facility]/Dr. Osei-Bonsu ° [address] [place] [address], [place] or [address], [place] (336) 841-8500 Phone number for both [place] and [place] locations is the same.  °[facility] [address], [place] (336) 299-0000   °[facility] [address] [place] [address], [place] or [address] (336) 852-7530 °(336) 878-2260   °[facility] [address],  (336) 350-1642, phone; (336) 294-5005, fax Sees patients 1st and 3rd Saturday of every month.  Must not qualify for public or private insurance (i.e. Medicaid, Medicare, [place] Health Choice, Veterans' Benefits) • Household income should be no more than 200% of the poverty level  •The clinic cannot treat you if you are pregnant or think you are pregnant • Sexually transmitted diseases are not treated at the clinic.  ° ° °  Dental Care: °Organization         Address  Phone  Notes  °[facility] [address], [place] (336) 641-6152 Accepts children up to age 21 who are enrolled in Medicaid or [place] Health Choice; pregnant women with a Medicaid card; and children who have applied for Medicaid or [place] Health Choice, but were declined, whose parents can pay a reduced fee at time of service.  °[facility] [place]  [address], [place] (336) 641-7733 Accepts children up to age 21 who are enrolled in Medicaid or [place] Health Choice; pregnant women with a Medicaid card; and children who have applied for Medicaid or [place] Health Choice, but were declined, whose parents can pay a reduced fee at time of service.  °[facility] ° [address], [place] (336) 641-4533 Patients are seen by appointment only. Walk-ins are not accepted. [facility] will see patients 18 years of age and older. °Monday - Tuesday (8am-5pm) °Most Wednesdays (8:30-5pm) °$30 per visit, cash only  °[facility] ° [address], [place] (336) 641-4533 Patients are seen by appointment only. Walk-ins are not accepted. [facility] will see patients 18 years of age and older. °One Wednesday Evening (Monthly: Volunteer Based).  $30 per visit, cash only  °[facility]  (919) 537-3737 for adults; Children under age 4, call Graduate Pediatric Dentistry at ([phone]) [phone]. Children aged 4-14, please call (919) [phone] to request a pediatric application. ° Dental services are provided in all areas of dental care including fillings, crowns and bridges, complete and partial dentures, implants, gum treatment, root canals, and extractions. Preventive care is  also provided. Treatment is provided to both adults and children. °Patients are selected via a lottery and there is often a waiting list. °  °[facility] [address], °[place] ° (336) 763-8833 [email or web address] °  °[facility] [address], [place], [place] (336)723-1848, Ext. 123 Second and Fourth Thursday of each month, opens at 6:30 AM; Clinic ends at 9 AM.  Patients are seen on a first-come first-served basis, and a limited number are seen during each clinic.  ° °[facility] ° [address], [place], [place] (336) 723-7904   Eligibility Requirements °You must have lived in [place], [place], or [place] counties for at least the last three months. °  You cannot be eligible for state or federal sponsored healthcare insurance, including Veterans Administration, Medicaid, or Medicare. °  You generally cannot be eligible for healthcare insurance through your employer.  °  How to apply: °Eligibility screenings are held every Tuesday and Wednesday afternoon from 1:00 pm until 4:00 pm. You do not need an appointment for the interview!  °[facility] [address], [place], [place] 336-631-2330   °[facility]  336-342-8273   °[facility]  336-703-3100   °[facility]  336-570-6415   ° °Behavioral Health Resources in the Community: °Intensive Outpatient Programs °Organization         Address  Phone  Notes  °[facility] [address], [place], [place] 336-878-6098   °[facility] [address], [place], [place] 336-832-9800   °[facility] [address], [place], [place] ° 336-882-2125   °[facility] [address],  °  [place], [place] 1-800-853-5163 or 336-641-4981   °Substance Abuse Resources °Organization         Address  Phone  Notes  °[facility]  336-882-2125   °[facility]  336-784-9470   °[facility]  336-285-9073   °[facility]  336-845-3988   °Residential & Outpatient Substance Abuse Program  1-800-659-3381   °Psychological Services °Organization         Address  Phone  Notes  ° Health  336- 832-9600   °[facility]  336- 378-7881   °[facility] [address], [place] 1-800-853-5163 or 336-641-4981   ° °Mobile Crisis Teams °Organization         Address  Phone  Notes  °[facility], Mobile Crisis Care Unit  1-877-626-1772   °Assertive °[facility] ° [address] [place], [place] 336-834-9664   °Sharon DeEsch [address] °[place] 336-554-5454   ° °Self-Help/Support Groups °Organization         Address  Phone             Notes  °[facility] [place] - variety of support groups  336- 373-1402 Call for more information  °Narcotics Anonymous (NA), [facility] [address], °[place]  2 meetings at this location  ° °Residential Treatment Programs °Organization         Address  Phone  Notes  °[facility] [address],    °[place]  1-866-801-8205   °[facility] ° [address], [place], [place] 704-293-8524   °[facility] [address], [place] 336-845-3988 Admissions: 8am-3pm M-F  °[facility] [address]    °[place], [place] 336-841-1104   °[facility] [address], [place], [place] 336-379-7146   °[facility] [address]  °[place], [place] 336-285-9073   °[facility] Intensive Outpatient [address], [place], [place] 336-852-3033   °[facility] [address]  °[place], [place] 1-877-615-2722 or 336-784-9470   °[facility] [address] [place], [place] 336-227-7417 Accepts Medicaid  °[facility] [address]  ° [place] 1-800-659-3381  Substance Abuse/Addiction Treatment  ° °[place] Behavioral Health Resources °Organization         Address  Phone  Notes  °[facility]  (888) 581-9988   °Julie Brannon, PhD [address] [place], [place]   (336) 349-5553 or (336) 951-0000   °[facility]   [address] °[place], [place] (336) 349-4454   °[facility] [address], [place], [place] (336) 342-8316 Insurance/Medicaid/sponsorship through [facility]  °[facility] [address]                                    [place], [place] (336) 342-8316 Therapy/tele-psych/case  °[facility] [address]  ° [place],  (336) 349-2233    °Dr. Arfeen  (336) 349-4544   °[facility] [place]  [employer] [place] [facility] 1) [address], [place] °2) [address], [place] °3)  [address], [place] (336) 349-3220 °(336) 342-7768 ° °(336) 342-8140   °[place] [facility] (336) 342-1394 or (336) 342-3537 (After Hours)    ° ° ° °

## 2013-11-22 NOTE — ED Notes (Signed)
Patient started with nausea about a week ago, then lower abdominal cramping the last several days.  Denies vaginal discharge or bleeding.  Patient has had 4 episodes of vomiting since this morning.

## 2013-11-26 NOTE — ED Provider Notes (Signed)
CSN: [id number]     Arrival date & time 11/22/13  1129 History   First MD Initiated Contact with Patient 11/22/13 1143     Chief Complaint  Patient presents with  . Emesis     (Consider location/radiation/quality/duration/timing/severity/associated sxs/prior Treatment) HPI  28 year old female with nausea and vomiting. Onset about a week ago. Slightly worse today. There is a 7 abdominal pain. No fevers or chills. No diarrhea. No urinary complaints. No unusual vaginal bleeding or discharge. She does not think she is pregnant. She is sexually active though not currently using birth control. No sick contacts. Recent evaluation in the emergency room for headache. This is currently resolved. No unusual vaginal bleeding or discharge.  Past Medical History  Diagnosis Date  . Headache(784.0)   . Back pain, chronic   . STD (sexually transmitted disease)     herpes   History reviewed. No pertinent past surgical history. Family History  Problem Relation Age of Onset  . Diabetes Paternal Grandmother    History  Substance Use Topics  . Smoking status: Former Games developer  . Smokeless tobacco: Never Used  . Alcohol Use: No     Comment: Has stopped for multiple months   OB History   Grav Para Term Preterm Abortions TAB SAB Ect Mult Living   0              Review of Systems  All systems reviewed and negative, other than as noted in HPI.   Allergies  Shellfish allergy  Home Medications   Prior to Admission medications   Medication Sig Start Date End Date Taking? Authorizing Provider  diphenhydrAMINE (BENADRYL) 25 MG tablet Take 25 mg by mouth every 6 (six) hours as needed for itching or allergies.   Yes Historical Provider, MD  ibuprofen (ADVIL,MOTRIN) 200 MG tablet Take 400 mg by mouth every 6 (six) hours as needed for moderate pain or cramping.   Yes Historical Provider, MD  SUMAtriptan (IMITREX) 50 MG tablet Take 1 tablet (50 mg total) by mouth every 2 (two) hours as needed for  migraine or headache ((max 4 tabs per 24 hours)). May repeat in 2 hours if headache persists or recurs. 11/14/13  Yes Linwood Dibbles, MD  ondansetron (ZOFRAN) 4 MG tablet Take 1 tablet (4 mg total) by mouth every 6 (six) hours. 11/22/13   Raeford Razor, MD   BP 120/72  Pulse 83  Temp(Src) 98.1 F (36.7 C) (Oral)  Resp 16  SpO2 100%  LMP 10/21/2013 Physical Exam  Nursing note and vitals reviewed. Constitutional: She appears well-developed and well-nourished. No distress.  Laying in bed. No acute distress.  HENT:  Head: Normocephalic and atraumatic.  Eyes: Conjunctivae are normal. Right eye exhibits no discharge. Left eye exhibits no discharge.  Neck: Neck supple.  Cardiovascular: Normal rate, regular rhythm and normal heart sounds.  Exam reveals no gallop and no friction rub.   No murmur heard. Pulmonary/Chest: Effort normal and breath sounds normal. No respiratory distress.  Abdominal: Soft. She exhibits no distension. There is no tenderness.  Genitourinary:  No CVA tenderness  Musculoskeletal: She exhibits no edema and no tenderness.  Neurological: She is alert.  Skin: Skin is warm and dry.  Psychiatric: She has a normal mood and affect. Her behavior is normal. Thought content normal.    ED Course  Procedures (including critical care time) Labs Review Labs Reviewed  CBC WITH DIFFERENTIAL - Abnormal; Notable for the following:    RBC 5.15 (*)    MCV 75.3 (*)  MCH 24.7 (*)    Neutrophils Relative % 82 (*)    Neutro Abs 8.3 (*)    Lymphocytes Relative 11 (*)    All other components within normal limits  POC URINE PREG, ED - Abnormal; Notable for the following:    Preg Test, Ur POSITIVE (*)    All other components within normal limits  COMPREHENSIVE METABOLIC PANEL  URINALYSIS, ROUTINE W REFLEX MICROSCOPIC    Imaging Review No results found.   EKG Interpretation None      MDM   Final diagnoses:  Pregnancy  Non-intractable vomiting with nausea, vomiting of  unspecified type    28 year old female with nausea and vomiting. Likely pregnancy related. Patient is approximately 4-5 weeks based on last menstrual period. No abdominal pain. No vaginal bleeding. Plan symptomatic treatment at this time. Given a prescription for Zofran. She is hemodynamically stable, appears  well and has a benign abdominal exam. Discussed need to start prenatal vitamins and to establish obstetrical care.   Raeford RazorStephen Faith Patricelli, MD 11/26/13 1434

## 2013-12-08 ENCOUNTER — Inpatient Hospital Stay (HOSPITAL_COMMUNITY)
Admission: AD | Admit: 2013-12-08 | Discharge: 2013-12-08 | Disposition: A | Discharge status: Home / Self Care | Payer: Medicaid Other | Source: Ambulatory Visit | Attending: Obstetrics & Gynecology | Admitting: Obstetrics & Gynecology

## 2013-12-08 ENCOUNTER — Encounter (HOSPITAL_COMMUNITY): Payer: Self-pay | Admitting: *Deleted

## 2013-12-08 DIAGNOSIS — R0602 Shortness of breath: Secondary | ICD-10-CM | POA: Insufficient documentation

## 2013-12-08 DIAGNOSIS — N63 Unspecified lump in unspecified breast: Secondary | ICD-10-CM | POA: Diagnosis present

## 2013-12-08 DIAGNOSIS — O9989 Other specified diseases and conditions complicating pregnancy, childbirth and the puerperium: Principal | ICD-10-CM

## 2013-12-08 DIAGNOSIS — O210 Mild hyperemesis gravidarum: Secondary | ICD-10-CM | POA: Diagnosis not present

## 2013-12-08 DIAGNOSIS — Z87891 Personal history of nicotine dependence: Secondary | ICD-10-CM | POA: Insufficient documentation

## 2013-12-08 DIAGNOSIS — O99891 Other specified diseases and conditions complicating pregnancy: Secondary | ICD-10-CM | POA: Diagnosis not present

## 2013-12-08 DIAGNOSIS — O219 Vomiting of pregnancy, unspecified: Secondary | ICD-10-CM

## 2013-12-08 DIAGNOSIS — N644 Mastodynia: Secondary | ICD-10-CM

## 2013-12-08 LAB — URINALYSIS, ROUTINE W REFLEX MICROSCOPIC
Bilirubin Urine: NEGATIVE
GLUCOSE, UA: NEGATIVE mg/dL
Hgb urine dipstick: NEGATIVE
Ketones, ur: NEGATIVE mg/dL
LEUKOCYTES UA: NEGATIVE
Nitrite: NEGATIVE
PH: 6 (ref 5.0–8.0)
Protein, ur: NEGATIVE mg/dL
SPECIFIC GRAVITY, URINE: 1.02 (ref 1.005–1.030)
Urobilinogen, UA: 0.2 mg/dL (ref 0.0–1.0)

## 2013-12-08 MED ORDER — ACETAMINOPHEN-CODEINE #3 300-30 MG PO TABS: 1.0000 | ORAL_TABLET | Freq: Four times a day (QID) | ORAL | Status: DC | PRN

## 2013-12-08 MED ORDER — IBUPROFEN 600 MG PO TABS
600.0000 mg | ORAL_TABLET | Freq: Once | ORAL | Status: AC
  Administered 2013-12-08: 600 mg via ORAL
  Filled 2013-12-08: qty 1

## 2013-12-08 MED ORDER — DOXYLAMINE-PYRIDOXINE 10-10 MG PO TBEC: 2.0000 | DELAYED_RELEASE_TABLET | Freq: Every day | ORAL | Status: DC

## 2013-12-08 NOTE — Discharge Instructions (Signed)

## 2013-12-08 NOTE — MAU Note (Signed)
Pt reports a breast mass x 5 days and SOB x 5 days.  LMP 10/21/2013, [facility]

## 2013-12-08 NOTE — MAU Provider Note (Signed)
History     CSN: [id number]  Arrival date and time: 12/08/13 4098   First Provider Initiated Contact with Patient 12/08/13 [phone]      Chief Complaint  Patient presents with  . Breast Mass  . Shortness of Breath   HPI  Kelsey Lynch is a 28 y.o. female G1P0 at [redacted]w[redacted]d who presents with concerns regarding a breast mass/ breast tenderness that she noticed 1 week ago. This is a new finding; she has never had a breast mass in the past. The mass is in her left breast and it is significantly painful to touch the area of concern. At the time she noticed the mass she has been experiencing shortness of breath. The shortness of breath occurs all the time and is not associated with activity. The patient complains of shortness of breath currently; pulse ox shows 100% on RA. Noted: she was seen at [facility] ED at the end of July with complaints of N/V and had a positive pregnancy test at the time. She was given Zofran RX however has been reluctant to take it. We discussed starting diclegis and patient agreed. Pt ambulated into the hospital without difficulty; pt is accompanied by her significant other.     OB History   Grav Para Term Preterm Abortions TAB SAB Ect Mult Living   1               Past Medical History  Diagnosis Date  . Headache(784.0)   . Back pain, chronic   . STD (sexually transmitted disease)     herpes    Past Surgical History  Procedure Laterality Date  . No past surgeries      Family History  Problem Relation Age of Onset  . Diabetes Paternal Grandmother     History  Substance Use Topics  . Smoking status: Former Games developer  . Smokeless tobacco: Never Used  . Alcohol Use: No     Comment: Has stopped for multiple months    Allergies:  Allergies  Allergen Reactions  . Shellfish Allergy Anaphylaxis and Swelling    Prescriptions prior to admission  Medication Sig Dispense Refill  . acetaminophen (TYLENOL) 500 MG tablet Take 1,000 mg by mouth daily as  needed for mild pain or moderate pain.      . diphenhydrAMINE (BENADRYL) 25 MG tablet Take 25 mg by mouth every 6 (six) hours as needed for itching or allergies.      . Prenatal Vit-Fe Fumarate-FA (PRENATAL MULTIVITAMIN) TABS tablet Take 1 tablet by mouth daily at 12 noon.       Results for orders placed during the hospital encounter of 12/08/13 (from the past 48 hour(s))  URINALYSIS, ROUTINE W REFLEX MICROSCOPIC     Status: None   Collection Time    12/08/13  8:41 AM      Result Value Ref Range   Color, Urine YELLOW  YELLOW   APPearance CLEAR  CLEAR   Specific Gravity, Urine 1.020  1.005 - 1.030   pH 6.0  5.0 - 8.0   Glucose, UA NEGATIVE  NEGATIVE mg/dL   Hgb urine dipstick NEGATIVE  NEGATIVE   Bilirubin Urine NEGATIVE  NEGATIVE   Ketones, ur NEGATIVE  NEGATIVE mg/dL   Protein, ur NEGATIVE  NEGATIVE mg/dL   Urobilinogen, UA 0.2  0.0 - 1.0 mg/dL   Nitrite NEGATIVE  NEGATIVE   Leukocytes, UA NEGATIVE  NEGATIVE   Comment: MICROSCOPIC NOT DONE ON URINES WITH NEGATIVE PROTEIN, BLOOD, LEUKOCYTES, NITRITE, OR GLUCOSE <  1000 mg/dL.    Review of Systems  Constitutional: Negative for fever and chills.  Respiratory: Positive for shortness of breath.   Gastrointestinal: Positive for nausea and vomiting. Negative for abdominal pain.  Skin:       +Left breast pain    Physical Exam   Blood pressure 112/70, pulse 69, temperature 98.4 F (36.9 C), temperature source Oral, resp. rate 16, height 5\' 2"  (1.575 m), weight 54.885 kg (121 lb), last menstrual period 10/21/2013, SpO2 100.00%.  Physical Exam  Constitutional: She is oriented to person, place, and time. Vital signs are normal. She appears well-developed and well-nourished.  Non-toxic appearance. She does not have a sickly appearance. She does not appear ill. No distress.  HENT:  Head: Normocephalic.  Eyes: Pupils are equal, round, and reactive to light.  Neck: Neck supple.  Respiratory: Effort normal and breath sounds normal. No  respiratory distress. She has no wheezes. She has no rales. She exhibits tenderness. She exhibits no mass, no edema, no deformity and no swelling. Right breast exhibits no inverted nipple, no mass, no nipple discharge, no skin change and no tenderness. Left breast exhibits tenderness. Left breast exhibits no inverted nipple, no mass, no nipple discharge and no skin change. Breasts are symmetrical.    Tenderness on palpation at 9 o'clock of left breast   GI: Soft.  Musculoskeletal: Normal range of motion.  Neurological: She is alert and oriented to person, place, and time.  Skin: Skin is warm. She is not diaphoretic.  Psychiatric: Her behavior is normal.    MAU Course  Procedures None  MDM UA Ibuprofen 600 mg PO times one dose in MAU  Discussed patient with Dr. Debroah LoopArnold.   Assessment and Plan   A: Left breast tenderness in first trimester of pregnancy Nausea and vomiting of pregnancy; first trimester  P:  Discharge home in stable condition RX: Tylenol #3        Diclegis: prior authorization form faxed to [phone] Keep your [facility] Alternate warm/cool compresses to affected area. Ok to take Ibuprofen 400 mg-600 mg every 6 hours PO as needed for pain; 3 days only.  If shortness of breath worsens; [facility].   Kelsey HansenJennifer Irene Victoriano Campion, NP 12/08/2013, 10:53 AM

## 2013-12-29 ENCOUNTER — Other Ambulatory Visit: Payer: Self-pay

## 2013-12-30 LAB — CYTOLOGY - PAP

## 2014-01-26 ENCOUNTER — Inpatient Hospital Stay (HOSPITAL_COMMUNITY)
Admission: AD | Admit: 2014-01-26 | Discharge: 2014-01-26 | Disposition: A | Discharge status: Home / Self Care | Payer: Medicaid Other | Source: Ambulatory Visit | Attending: Obstetrics and Gynecology | Admitting: Obstetrics and Gynecology

## 2014-01-26 ENCOUNTER — Encounter (HOSPITAL_COMMUNITY): Payer: Self-pay | Admitting: *Deleted

## 2014-01-26 DIAGNOSIS — O99891 Other specified diseases and conditions complicating pregnancy: Secondary | ICD-10-CM | POA: Insufficient documentation

## 2014-01-26 DIAGNOSIS — O9989 Other specified diseases and conditions complicating pregnancy, childbirth and the puerperium: Principal | ICD-10-CM

## 2014-01-26 DIAGNOSIS — IMO0001 Reserved for inherently not codable concepts without codable children: Secondary | ICD-10-CM

## 2014-01-26 DIAGNOSIS — M7918 Myalgia, other site: Secondary | ICD-10-CM

## 2014-01-26 DIAGNOSIS — R109 Unspecified abdominal pain: Secondary | ICD-10-CM | POA: Insufficient documentation

## 2014-01-26 DIAGNOSIS — Z87891 Personal history of nicotine dependence: Secondary | ICD-10-CM | POA: Insufficient documentation

## 2014-01-26 LAB — URINALYSIS, ROUTINE W REFLEX MICROSCOPIC
Bilirubin Urine: NEGATIVE
Glucose, UA: NEGATIVE mg/dL
Hgb urine dipstick: NEGATIVE
Ketones, ur: NEGATIVE mg/dL
Leukocytes, UA: NEGATIVE
Nitrite: NEGATIVE
Protein, ur: NEGATIVE mg/dL
Specific Gravity, Urine: 1.02 (ref 1.005–1.030)
Urobilinogen, UA: 0.2 mg/dL (ref 0.0–1.0)
pH: 6 (ref 5.0–8.0)

## 2014-01-26 NOTE — MAU Note (Signed)
Pt reports sharp pain in left mid/upper abd today, worsening this pm. States she is having difficulty breathing for the last 30 minutes

## 2014-01-26 NOTE — MAU Note (Signed)
PT SAYS SHE GETS [facility]  WITH DR Chestine Spore-    HAS AN APPOINTMENT   ON 9-30-  WHICH IS 2ND VISIT.  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  PAIN CONTINUED  ON Sunday AT WORK-   PAIN TRAVELLED  TO LOWER  ABD.   THEN  TODAY - OFF/ ON .  TODAY SHE SAT - SO PAIN  LESS  AND BREATHING WAS EASIER.    THEN  TONIGHT AT 9PM-  PAIN WORSE  AND BREATHING FAST.     PAIN IS NOW  -8.5.  .   NO VAG BLEEDING.      FEELS NAUSEA.   GOING TO GET RX  TOMORROW-  FROM LAST VISIT  HERE IN MAU .    LAST  SEX-    Thursday.

## 2014-01-26 NOTE — Discharge Instructions (Signed)

## 2014-01-26 NOTE — MAU Provider Note (Signed)
First Provider Initiated Contact with Patient 01/26/14 2225      Chief Complaint:  Abdominal Pain   Kelsey Lynch is  28 y.o. G1P0 at [redacted]w[redacted]d presents complaining of Abdominal Pain She went back to work Saturday after having been out for a while for hyperemesis.  She started noticing Left sided abdominal pain that started below the rib area and would shoot into the lower abdomen when she moved "a lot" or "tried to take a really deep breath". Feels as if she is having to "make myself breathe" and feels like she is breathing fast for the past 1/2 hour.  Normal BM's, no recent vomiting.   Obstetrical/Gynecological History: OB History   Grav Para Term Preterm Abortions TAB SAB Ect Mult Living   1              Past Medical History: Past Medical History  Diagnosis Date  . Headache(784.0)   . Back pain, chronic   . STD (sexually transmitted disease)     herpes    Past Surgical History: Past Surgical History  Procedure Laterality Date  . No past surgeries      Family History: Family History  Problem Relation Age of Onset  . Diabetes Paternal Grandmother     Social History: History  Substance Use Topics  . Smoking status: Former Games developer  . Smokeless tobacco: Never Used  . Alcohol Use: No     Comment: Has stopped for multiple months    Allergies:  Allergies  Allergen Reactions  . Shellfish Allergy Anaphylaxis and Swelling    Meds:  Prescriptions prior to admission  Medication Sig Dispense Refill  . acetaminophen (TYLENOL) 500 MG tablet Take 1,000 mg by mouth daily as needed for mild pain or moderate pain.      . Doxylamine-Pyridoxine (DICLEGIS) 10-10 MG TBEC Take 1-2 tablets by mouth 2 (two) times daily. Take 1 tablet in the morning and 2 tablets at bedtime      . Prenatal Vit-Fe Fumarate-FA (PRENATAL MULTIVITAMIN) TABS tablet Take 1 tablet by mouth daily at 12 noon.        Review of Systems   Constitutional: Negative for fever and chills Eyes: Negative for visual  disturbances Respiratory: Negative for SOB, dyspnea Cardiovascular: Negative for chest pain or palpitations  Gastrointestinal: Negative for vomiting, diarrhea and constipation Genitourinary: Negative for dysuria and urgency Musculoskeletal: Negative for back pain, joint pain, myalgias  Neurological: Negative for dizziness and headaches     Physical Exam  Blood pressure 112/66, pulse 74, temperature 99.2 F (37.3 C), temperature source Oral, resp. rate 18, height  (1.575 m), weight 57.38 kg (126 lb 8 oz), last menstrual period 10/21/2013, SpO2 100.00%. GENERAL: Well-developed, well-nourished female in no acute distress.  LUNGS: Clear to auscultation bilaterally.  HEART: Regular rate and rhythm. ABDOMEN: Soft, nontender, nondistended. Pain is not reproducable.  Not in rib area. Isolated from mid abdomin to above iliac crest on left side EXTREMITIES: Nontender, no edema, 2+ distal pulses. DTR's 2+  FHT:  156 via doppler Labs: Results for orders placed during the hospital encounter of 01/26/14 (from the past 24 hour(s))  URINALYSIS, ROUTINE W REFLEX MICROSCOPIC   Collection Time    01/26/14  9:50 PM      Result Value Ref Range   Color, Urine YELLOW  YELLOW   APPearance CLEAR  CLEAR   Specific Gravity, Urine 1.020  1.005 - 1.030   pH 6.0  5.0 - 8.0   Glucose, UA NEGATIVE  NEGATIVE  mg/dL   Hgb urine dipstick NEGATIVE  NEGATIVE   Bilirubin Urine NEGATIVE  NEGATIVE   Ketones, ur NEGATIVE  NEGATIVE mg/dL   Protein, ur NEGATIVE  NEGATIVE mg/dL   Urobilinogen, UA 0.2  0.0 - 1.0 mg/dL   Nitrite NEGATIVE  NEGATIVE   Leukocytes, UA NEGATIVE  NEGATIVE   Imaging Studies:  No results found.  Assessment: Kelsey Lynch is  28 y.o. G1P0 at [redacted]w[redacted]d presents with probable musculoskeletal pain. Discussed findings with Dr. Claiborne Billings.  Plan: D/C home Try Tylenol and warm packs.  F/U 2 days with Dr. Chestine Spore as scheduled.  CRESENZO-DISHMAN,Shiza Thelen 9/28/201510:37 PM

## 2014-02-27 ENCOUNTER — Inpatient Hospital Stay (HOSPITAL_COMMUNITY)
Admission: AD | Admit: 2014-02-27 | Discharge: 2014-02-27 | Disposition: A | Discharge status: Home / Self Care | Payer: Medicaid Other | Source: Ambulatory Visit | Attending: Obstetrics and Gynecology | Admitting: Obstetrics and Gynecology

## 2014-02-27 ENCOUNTER — Encounter (HOSPITAL_COMMUNITY): Payer: Self-pay

## 2014-02-27 DIAGNOSIS — R102 Pelvic and perineal pain: Secondary | ICD-10-CM | POA: Insufficient documentation

## 2014-02-27 DIAGNOSIS — Z87891 Personal history of nicotine dependence: Secondary | ICD-10-CM | POA: Insufficient documentation

## 2014-02-27 DIAGNOSIS — O26852 Spotting complicating pregnancy, second trimester: Secondary | ICD-10-CM | POA: Diagnosis not present

## 2014-02-27 DIAGNOSIS — O26892 Other specified pregnancy related conditions, second trimester: Secondary | ICD-10-CM | POA: Insufficient documentation

## 2014-02-27 DIAGNOSIS — O26899 Other specified pregnancy related conditions, unspecified trimester: Secondary | ICD-10-CM

## 2014-02-27 DIAGNOSIS — Z3A19 19 weeks gestation of pregnancy: Secondary | ICD-10-CM | POA: Insufficient documentation

## 2014-02-27 DIAGNOSIS — N949 Unspecified condition associated with female genital organs and menstrual cycle: Secondary | ICD-10-CM

## 2014-02-27 DIAGNOSIS — O9989 Other specified diseases and conditions complicating pregnancy, childbirth and the puerperium: Secondary | ICD-10-CM

## 2014-02-27 DIAGNOSIS — O4692 Antepartum hemorrhage, unspecified, second trimester: Secondary | ICD-10-CM

## 2014-02-27 HISTORY — DX: Unspecified infectious disease: B99.9

## 2014-02-27 HISTORY — DX: Unspecified abnormal cytological findings in specimens from vagina: R87.629

## 2014-02-27 LAB — URINALYSIS, ROUTINE W REFLEX MICROSCOPIC
Bilirubin Urine: NEGATIVE
Glucose, UA: NEGATIVE mg/dL
HGB URINE DIPSTICK: NEGATIVE
Ketones, ur: NEGATIVE mg/dL
Leukocytes, UA: NEGATIVE
Nitrite: NEGATIVE
PROTEIN: NEGATIVE mg/dL
Specific Gravity, Urine: 1.025 (ref 1.005–1.030)
UROBILINOGEN UA: 0.2 mg/dL (ref 0.0–1.0)
pH: 6.5 (ref 5.0–8.0)

## 2014-02-27 MED ORDER — CYCLOBENZAPRINE HCL 10 MG PO TABS
10.0000 mg | ORAL_TABLET | Freq: Once | ORAL | Status: AC
  Administered 2014-02-27: 10 mg via ORAL
  Filled 2014-02-27: qty 1

## 2014-02-27 NOTE — MAU Note (Signed)
Patient states she has been having abdominal cramping for a couple of days. Has had spotting on and off for the past 2 days.Has nausea, no vomiting. Has been having migraine headaches.

## 2014-02-27 NOTE — MAU Provider Note (Signed)
History     CSN: [id number]  Arrival date and time: 02/27/14 1148   None     Chief Complaint  Patient presents with  . Abdominal Cramping  . Vaginal Discharge   HPI  Kelsey Lynch is 28 y.o. G1P0 4474w1d weeks presenting with spotting that began yesterday and today.  She is a patient of Dr. Ophelia Lynch's. She had bright red bleeding 4 days ago.  She spoke to Dr. Chestine Lynch yesterday and was instructed to come to MAU  today.  She is having cramping.  [facility] Bleeding occurred prior to last intercourse 2 days ago.   Morning sickness much better.   Is having headaches.  Denies vaginal discharge or odor.     Past Medical History  Diagnosis Date  . Headache(784.0)   . Back pain, chronic   . STD (sexually transmitted disease)     herpes  . Infection     UTI  . Vaginal Pap smear, abnormal     bx and trx    Past Surgical History  Procedure Laterality Date  . No past surgeries      Family History  Problem Relation Age of Onset  . Diabetes Paternal Grandmother     History  Substance Use Topics  . Smoking status: Former Games developermoker  . Smokeless tobacco: Never Used     Comment: quit before preg  . Alcohol Use: No     Comment: Has stopped for multiple months    Allergies:  Allergies  Allergen Reactions  . Shellfish Allergy Anaphylaxis and Swelling    Prescriptions prior to admission  Medication Sig Dispense Refill  . acetaminophen (TYLENOL) 325 MG tablet Take 650 mg by mouth every 6 (six) hours as needed for mild pain or headache.      Marland Kitchen. acyclovir (ZOVIRAX) 400 MG tablet Take 400 mg by mouth 2 (two) times daily.      . butalbital-acetaminophen-caffeine (FIORICET, ESGIC) 50-325-40 MG per tablet Take 1 tablet by mouth 2 (two) times daily as needed for headache.      . Doxylamine-Pyridoxine (DICLEGIS) 10-10 MG TBEC Take 1-2 tablets by mouth 2 (two) times daily. Take 1 tablet in the morning and 2 tablets at bedtime      . ondansetron (ZOFRAN) 4 MG  tablet Take 4 mg by mouth every 8 (eight) hours as needed for nausea or vomiting.        Review of Systems  Constitutional: Negative for fever and chills.  Gastrointestinal: Positive for abdominal pain (left lower quad). Negative for nausea and vomiting.  Genitourinary: Negative for dysuria, urgency, frequency and hematuria.       + for vaginal bleeding 2 days ago and dark brown spotting yesterday.  Neurological: Positive for headaches.   Physical Exam   Blood pressure 106/55, pulse 79, temperature 98.5 F (36.9 C), temperature source Oral, resp. rate 16, height 5\' 3"  (1.6 m), weight 127 lb 9.6 oz (57.879 kg), last menstrual period 10/21/2013, SpO2 98.00%.  Physical Exam  Constitutional: She is oriented to person, place, and time. She appears well-developed and well-nourished. No distress.  HENT:  Head: Normocephalic.  Neck: Normal range of motion.  Cardiovascular: Normal rate.   Respiratory: Effort normal.  GI: Soft. She exhibits no distension and no mass. There is no tenderness. There is no rebound and no guarding.  Genitourinary:  Uterus measures 19 weeks, nontender but patient states she is having cramping rating 7/10.  Neg for vaginal bleeding.  FHR 153  Neurological: She is alert and oriented to person, place, and time.  Skin: Skin is warm and dry.  Psychiatric: She has a normal mood and affect. Her behavior is normal.   Results for orders placed during the hospital encounter of 02/27/14 (from the past 24 hour(s))  URINALYSIS, ROUTINE W REFLEX MICROSCOPIC     Status: None   Collection Time    02/27/14 12:10 PM      Result Value Ref Range   Color, Urine YELLOW  YELLOW   APPearance CLEAR  CLEAR   Specific Gravity, Urine 1.025  1.005 - 1.030   pH 6.5  5.0 - 8.0   Glucose, UA NEGATIVE  NEGATIVE mg/dL   Hgb urine dipstick NEGATIVE  NEGATIVE   Bilirubin Urine NEGATIVE  NEGATIVE   Ketones, ur NEGATIVE  NEGATIVE mg/dL   Protein, ur NEGATIVE  NEGATIVE mg/dL   Urobilinogen, UA  0.2  0.0 - 1.0 mg/dL   Nitrite NEGATIVE  NEGATIVE   Leukocytes, UA NEGATIVE  NEGATIVE   MAU Course  Procedures  MDM Discussed MSE, physical findings and labs with Dr. Tenny Crawoss.  Consider Round Ligament Pain.  Order given for Pelvic rest if bleeding and give Flexoril 10mg  po X 1 in MAU.  F/U with Dr. Chestine Lynch  Assessment and Plan  A:  Round ligament pain in second trimester      Hx of vaginal bleeding      P:  Flexeril 10mg  po given in MAU          Instructed to avoid intercourse if bleeding     Call for increase bleeding     F/U with Dr. Chestine Lynch for increase sxs  Kelsey Lynch,EVE M 02/27/2014, 1:28 PM

## 2014-02-27 NOTE — Discharge Instructions (Signed)
Abdominal Pain During Pregnancy °Belly (abdominal) pain is common during pregnancy. Most of the time, it is not a serious problem. Other times, it can be a sign that something is wrong with the pregnancy. Always tell your doctor if you have belly pain. °HOME CARE °Monitor your belly pain for any changes. The following actions may help you feel better: °· Do not have sex (intercourse) or put anything in your vagina until you feel better. °· Rest until your pain stops. °· Drink clear fluids if you feel sick to your stomach (nauseous). Do not eat solid food until you feel better. °· Only take medicine as told by your doctor. °· Keep all doctor visits as told. °GET HELP RIGHT AWAY IF:  °· You are bleeding, leaking fluid, or pieces of tissue come out of your vagina. °· You have more pain or cramping. °· You keep throwing up (vomiting). °· You have pain when you pee (urinate) or have blood in your pee. °· You have a fever. °· You do not feel your baby moving as much. °· You feel very weak or feel like passing out. °· You have trouble breathing, with or without belly pain. °· You have a very bad headache and belly pain. °· You have fluid leaking from your vagina and belly pain. °· You keep having watery poop (diarrhea). °· Your belly pain does not go away after resting, or the pain gets worse. °MAKE SURE YOU:  °· Understand these instructions. °· Will watch your condition. °· Will get help right away if you are not doing well or get worse. °Document Released: 04/05/2009 Document Revised: 12/18/2012 Document Reviewed: 11/14/2012 °ExitCare® Patient Information ©2015 [employer]. This information is not intended to replace advice given to you by your health care provider. Make sure you discuss any questions you have with your health care provider. ° °

## 2014-02-27 NOTE — MAU Note (Signed)
This was her first episode of bleeding during this pregnancy.  Started prior to intercourse 3 days ago. Cramping was worse yesterday

## 2014-03-02 ENCOUNTER — Encounter (HOSPITAL_COMMUNITY): Payer: Self-pay

## 2014-03-03 ENCOUNTER — Encounter (HOSPITAL_COMMUNITY): Payer: Self-pay | Admitting: Obstetrics

## 2014-03-10 ENCOUNTER — Encounter (HOSPITAL_COMMUNITY): Payer: Medicaid Other

## 2014-03-17 ENCOUNTER — Ambulatory Visit (HOSPITAL_COMMUNITY)
Admission: RE | Admit: 2014-03-17 | Discharge: 2014-03-17 | Disposition: A | Discharge status: Home / Self Care | Payer: Medicaid Other | Source: Ambulatory Visit | Attending: Obstetrics | Admitting: Obstetrics

## 2014-03-17 ENCOUNTER — Ambulatory Visit (HOSPITAL_COMMUNITY): Payer: Medicaid Other

## 2014-03-17 DIAGNOSIS — D563 Thalassemia minor: Secondary | ICD-10-CM

## 2014-03-19 DIAGNOSIS — D563 Thalassemia minor: Secondary | ICD-10-CM | POA: Insufficient documentation

## 2014-03-19 NOTE — Progress Notes (Addendum)
Genetic Counseling  High-Risk Gestation Note  Appointment Date:  03/17/2014 Referred By: Jerelyn Charles, MD Date of Birth:  07/27/1985 Partner:  Gaynelle Lynch   Pregnancy History: G1P0 Estimated Date of Delivery: 07/23/14 Estimated Gestational Age: 34w5dAttending: MRenella Cunas MD   I met with 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 [employer] Genetic Counseling Intern, VSantiago Bumpers assisted with genetic counseling under my direct supervision.   Ms. 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 The father of the pregnancy has not yet had hemoglobin electrophoresis performed. We reviewed both family histories, and there are no known reported relatives with hemoglobin variants or a hemoglobinopathy. [name] [place]estry, and Kelsey Lynch reported [place]. Consanguinity was denied. The family histories are noncontributory for birth defects, intellectual disability, and known genetic conditions. Without further information regarding the provided family history, an accurate genetic risk cannot be calculated. Further genetic counseling is warranted if more information is obtained.  We discussed that hemoglobin is the oxygen-carrying pigment of red blood cells.  The type of hemoglobin we have is determined by inheritance.  There are different genes that make hemoglobin and its subunits; examples include alpha globin genes and beta globin genes.  It is not uncommon for a mistake to exist in a hemoglobin gene which may produce a hemoglobin variant. We discussed genes and chromosomes. We reviewed the autosomal recessive inheritance of  hemoglobinopathies. A pregnancy would have a 1 in 4 (25%) chance to inherit a hemoglobinopathy, such as thalassemia, when both parents are carriers of a hemoglobin variant affecting the same hemoglobin chain (such as both parents carrying alpha globin variants).   Alpha thalassemia differs in its inheritance from beta chain hemoglobinopathies as there are two alpha globin genes on each chromosome 16 (aa/aa).  A person can be a carrier of one alpha gene mutation (aa/a-), sometimes referred to as a "silent carrier" or of more than one mutation. Alpha thalassemia trait typically results from carrying 2 alpha globin genes with deletions (and 2 functional copies of the gene).  A person that carries only one hemoglobin variant typically has no related health concerns beyond mild anemia in some cases. A person who carriers two alpha globin gene mutations resulting in deletions can either carry them in cis (both on the same chromosome), denoted as "aa/--", or in trans (on different chromosomes) denoted as "a-/a-" .  Southeast Asian alpha thalassemia carriers usually have a cis arrangement (aa/--) of their alpha globin gene mutations.  Thus, carriers of two alpha globin gene deletions are at risk for having a child with the most severe form of alpha thalassemia, hydrops fetalis with Hb Barts, which is associated with an absence of alpha globin chain synthesis as a result of deletions of all four alpha globin genes (--/--). Alpha thalassemia carriers of African descent, typically carry the alpha globin gene mutations in trans (a-/a-). Individuals who carry the alpha thalassemia mutations in trans are not at risk to have a baby with Hb Barts disease, but could be at risk to have a baby with hemoglobin H disease (--/-a), if their partner has alpha thalassemia trait and carries the mutations in cis.  In the case where both partners carry alpha thalassemia trait  in trans (a-/a-), all of their offspring would be expected to  inherit alpha thalassemia trait in trans (a-/a-).  Hemoglobin Constant Spring (Hb CS) is a non-deleted alpha chain hemoglobin variant that is relatively common among Southeast Asians.  Hb CS can lead to Hemoglobin H disease when inherited along with alpha thalassemia trait in cis (aa/--).  Hemoglobin Constant Spring inherited with one deletion would not typically be expected to lead to clinically significant disease.    We discussed that Kelsey Lynch has an approximate 1 in 30 chance to carry alpha thalassemia trait, which is the general population risk. Thus, prior to screening for the father of the pregnancy, the risk for Hemoglobin H disease due to alpha thalassemia trait and constant spring would be approximately 1 in 120. However, we discussed that this risk assessment is likely an overestimate given that if he does carry alpha thalassemia trait, it is more likely to be in trans (a-/a-), which would not put the pregnancy at risk for hemoglobin H disease. We discussed the option of carrier screening for hemoglobin variants via hemoglobin electrophoresis, complete blood count, and ferritin studies. Kelsey Lynch elected to proceed with carrier screening at the time of today's visit. We will contact this couple and forward results to Kelsey Lynch's OB office, once results are available.  We discussed that prenatal ultrasound would not be expected to detect features of a hemoglobinopathy in most cases, aside from hemoglobin Barts disease, for which this pregnancy is not expected to be at risk. We discussed that hemoglobinopathies are routinely screened for as part of the [place] newborn screening panel.    Kelsey Lynch was provided with written information regarding cystic fibrosis (CF) including the carrier frequency and incidence in the African American and Asian populations, the availability of carrier testing and prenatal diagnosis if indicated.  In addition, we discussed that CF is routinely screened for as  part of the [place] newborn screening panel.  She expressed interest in CF carrier screening but declined having the screening drawn today.   Kelsey Lynch denied exposure to environmental toxins or chemical agents. She denied the use of tobacco or street drugs. She reported drinking two glasses of red wine approximately 1 week ago.  Prenatal alcohol exposure can increase the risk for growth delays, small head size, heart defects, eye and facial differences, as well as behavior problems and learning disabilities. The risk of these to occur tends to increase with the amount of alcohol consumed. However, because there is no identified safe amount of alcohol in pregnancy, it is recommended to completely avoid alcohol in pregnancy. Given the reported amount of exposure, risk for associated effects are likely low in the current pregnancy. She denied significant viral illnesses during the course of her pregnancy. Her medical and surgical histories were noncontributory.   I counseled this couple regarding the above risks and available options.  The approximate face-to-face time with the genetic counselor was 40 minutes.  Chipper Oman, MS Certified Genetic Counselor 03/19/2014

## 2014-03-20 ENCOUNTER — Telehealth (HOSPITAL_COMMUNITY): Payer: Self-pay | Admitting: MS"

## 2014-03-20 ENCOUNTER — Other Ambulatory Visit (HOSPITAL_COMMUNITY): Payer: Self-pay

## 2014-03-20 NOTE — Telephone Encounter (Signed)
[facility] Counsel[facility] Intern, Gwenyth BouillonVictoria Roth, called Mr. Kelsey CrockerBrandon Lynch, father of the pregnancy for [name] regarding his hemoglobinopathy screening. 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 Discussed with Mr. Kelsey Lynch that his hemoglobin electrophoresis, complete blood count, and ferritin studies were within normal range. Thus, he does not appear to have a clinically significant hemoglobin variant. This screen does not rule out the possibility of being a silent carrier for alpha thalassemia, but this significantly reduces the risk for alpha thalassemia disease status in the current pregnancy.   Kelsey BraunKaren Marifer Lynch 03/20/2014 11:39 AM

## 2014-05-01 NOTE — L&D Delivery Note (Signed)
Delivery Note Patient was noted to be 10/10/+3.  She pushed well with two contractions and at 7:32 PM a viable female was delivered via Vaginal, Spontaneous Delivery (Presentation: Right Occiput Anterior).  APGAR: 8, 9; weight 6 lb 6.8 oz (2914 g).   Placenta status: Intact, Spontaneous.  Cord: 3 vessels with the following complications: None.  Cord pH: n/a  Anesthesia: Epidural  Episiotomy: None Lacerations: bilateral labial lacerations Suture Repair: 3.0 vicryl rapide Est. Blood Loss (mL): 250  Mom to postpartum.  Baby to Couplet care / Skin to Skin.  Johnae Friley GEFFEL Lukah Goswami 07/17/2014, 9:05 PM

## 2014-06-19 ENCOUNTER — Other Ambulatory Visit: Payer: Self-pay

## 2014-06-19 LAB — OB RESULTS CONSOLE GBS: STREP GROUP B AG: POSITIVE

## 2014-07-17 ENCOUNTER — Inpatient Hospital Stay (HOSPITAL_COMMUNITY): Payer: Medicaid Other | Admitting: Anesthesiology

## 2014-07-17 ENCOUNTER — Encounter (HOSPITAL_COMMUNITY): Payer: Self-pay

## 2014-07-17 ENCOUNTER — Inpatient Hospital Stay (HOSPITAL_COMMUNITY)
Admission: AD | Admit: 2014-07-17 | Discharge: 2014-07-19 | DRG: 775 | Disposition: A | Discharge status: Home / Self Care | Payer: Medicaid Other | Source: Ambulatory Visit | Attending: Obstetrics | Admitting: Obstetrics

## 2014-07-17 DIAGNOSIS — Z3A39 39 weeks gestation of pregnancy: Secondary | ICD-10-CM | POA: Diagnosis present

## 2014-07-17 DIAGNOSIS — O99824 Streptococcus B carrier state complicating childbirth: Secondary | ICD-10-CM | POA: Diagnosis present

## 2014-07-17 DIAGNOSIS — O4200 Premature rupture of membranes, onset of labor within 24 hours of rupture, unspecified weeks of gestation: Secondary | ICD-10-CM | POA: Diagnosis present

## 2014-07-17 DIAGNOSIS — Z87891 Personal history of nicotine dependence: Secondary | ICD-10-CM

## 2014-07-17 DIAGNOSIS — D563 Thalassemia minor: Secondary | ICD-10-CM

## 2014-07-17 HISTORY — DX: Herpesviral infection, unspecified: B00.9

## 2014-07-17 LAB — CBC
HCT: 35.5 % — ABNORMAL LOW (ref 36.0–46.0)
Hemoglobin: 11.8 g/dL — ABNORMAL LOW (ref 12.0–15.0)
MCH: 24.3 pg — ABNORMAL LOW (ref 26.0–34.0)
MCHC: 33.2 g/dL (ref 30.0–36.0)
MCV: 73.2 fL — ABNORMAL LOW (ref 78.0–100.0)
PLATELETS: 281 10*3/uL (ref 150–400)
RBC: 4.85 MIL/uL (ref 3.87–5.11)
RDW: 16.7 % — ABNORMAL HIGH (ref 11.5–15.5)
WBC: 15.7 10*3/uL — ABNORMAL HIGH (ref 4.0–10.5)

## 2014-07-17 LAB — OB RESULTS CONSOLE RUBELLA ANTIBODY, IGM: Rubella: IMMUNE

## 2014-07-17 LAB — OB RESULTS CONSOLE HEPATITIS B SURFACE ANTIGEN: Hepatitis B Surface Ag: NEGATIVE

## 2014-07-17 LAB — TYPE AND SCREEN
ABO/RH(D): B POS
Antibody Screen: NEGATIVE

## 2014-07-17 LAB — OB RESULTS CONSOLE HIV ANTIBODY (ROUTINE TESTING): HIV: NONREACTIVE

## 2014-07-17 LAB — OB RESULTS CONSOLE ANTIBODY SCREEN: Antibody Screen: NEGATIVE

## 2014-07-17 LAB — OB RESULTS CONSOLE GC/CHLAMYDIA
Chlamydia: NEGATIVE
Gonorrhea: NEGATIVE

## 2014-07-17 LAB — OB RESULTS CONSOLE RPR: RPR: NONREACTIVE

## 2014-07-17 LAB — OB RESULTS CONSOLE ABO/RH: RH Type: POSITIVE

## 2014-07-17 LAB — ABO/RH: ABO/RH(D): B POS

## 2014-07-17 LAB — POCT FERN TEST: POCT FERN TEST: POSITIVE

## 2014-07-17 MED ORDER — LIDOCAINE HCL (PF) 1 % IJ SOLN
30.0000 mL | INTRAMUSCULAR | Status: DC | PRN
  Filled 2014-07-17: qty 30

## 2014-07-17 MED ORDER — ONDANSETRON HCL 4 MG PO TABS: 4.0000 mg | ORAL_TABLET | ORAL | Status: DC | PRN

## 2014-07-17 MED ORDER — OXYCODONE-ACETAMINOPHEN 5-325 MG PO TABS: 1.0000 | ORAL_TABLET | ORAL | Status: DC | PRN

## 2014-07-17 MED ORDER — ACETAMINOPHEN 325 MG PO TABS: 650.0000 mg | ORAL_TABLET | ORAL | Status: DC | PRN

## 2014-07-17 MED ORDER — PRENATAL MULTIVITAMIN CH
1.0000 | ORAL_TABLET | Freq: Every day | ORAL | Status: DC
  Administered 2014-07-18 – 2014-07-19 (×2): 1 via ORAL
  Filled 2014-07-17 (×2): qty 1

## 2014-07-17 MED ORDER — IBUPROFEN 600 MG PO TABS
600.0000 mg | ORAL_TABLET | Freq: Four times a day (QID) | ORAL | Status: DC
  Administered 2014-07-18 – 2014-07-19 (×6): 600 mg via ORAL
  Filled 2014-07-17 (×7): qty 1

## 2014-07-17 MED ORDER — SIMETHICONE 80 MG PO CHEW: 80.0000 mg | CHEWABLE_TABLET | ORAL | Status: DC | PRN

## 2014-07-17 MED ORDER — DIPHENHYDRAMINE HCL 50 MG/ML IJ SOLN: 12.5000 mg | INTRAMUSCULAR | Status: DC | PRN

## 2014-07-17 MED ORDER — OXYCODONE-ACETAMINOPHEN 5-325 MG PO TABS
2.0000 | ORAL_TABLET | ORAL | Status: DC | PRN
  Administered 2014-07-18 – 2014-07-19 (×5): 2 via ORAL
  Filled 2014-07-17 (×5): qty 2

## 2014-07-17 MED ORDER — PHENYLEPHRINE 40 MCG/ML (10ML) SYRINGE FOR IV PUSH (FOR BLOOD PRESSURE SUPPORT): 80.0000 ug | PREFILLED_SYRINGE | INTRAVENOUS | Status: DC | PRN

## 2014-07-17 MED ORDER — OXYTOCIN 40 UNITS IN LACTATED RINGERS INFUSION - SIMPLE MED
1.0000 m[IU]/min | INTRAVENOUS | Status: DC
  Administered 2014-07-17: 2 m[IU]/min via INTRAVENOUS

## 2014-07-17 MED ORDER — LACTATED RINGERS IV SOLN
INTRAVENOUS | Status: DC
  Administered 2014-07-17 (×3): via INTRAVENOUS

## 2014-07-17 MED ORDER — PENICILLIN G POTASSIUM 5000000 UNITS IJ SOLR
5.0000 10*6.[IU] | Freq: Once | INTRAVENOUS | Status: AC
  Administered 2014-07-17: 5 10*6.[IU] via INTRAVENOUS
  Filled 2014-07-17: qty 5

## 2014-07-17 MED ORDER — OXYTOCIN 40 UNITS IN LACTATED RINGERS INFUSION - SIMPLE MED
62.5000 mL/h | INTRAVENOUS | Status: DC
  Administered 2014-07-17: 62.5 mL/h via INTRAVENOUS
  Filled 2014-07-17: qty 1000

## 2014-07-17 MED ORDER — OXYCODONE-ACETAMINOPHEN 5-325 MG PO TABS
1.0000 | ORAL_TABLET | ORAL | Status: DC | PRN
  Administered 2014-07-19: 1 via ORAL
  Filled 2014-07-17: qty 1

## 2014-07-17 MED ORDER — PHENYLEPHRINE 40 MCG/ML (10ML) SYRINGE FOR IV PUSH (FOR BLOOD PRESSURE SUPPORT)
80.0000 ug | PREFILLED_SYRINGE | INTRAVENOUS | Status: DC | PRN
  Filled 2014-07-17: qty 20

## 2014-07-17 MED ORDER — FENTANYL 2.5 MCG/ML BUPIVACAINE 1/10 % EPIDURAL INFUSION (WH - ANES)
14.0000 mL/h | INTRAMUSCULAR | Status: DC | PRN
  Administered 2014-07-17: 14 mL/h via EPIDURAL
  Filled 2014-07-17: qty 125

## 2014-07-17 MED ORDER — CITRIC ACID-SODIUM CITRATE 334-500 MG/5ML PO SOLN: 30.0000 mL | ORAL | Status: DC | PRN

## 2014-07-17 MED ORDER — BUTALBITAL-APAP-CAFFEINE 50-325-40 MG PO TABS
1.0000 | ORAL_TABLET | Freq: Four times a day (QID) | ORAL | Status: DC | PRN
  Administered 2014-07-17 (×2): 1 via ORAL
  Filled 2014-07-17 (×3): qty 1

## 2014-07-17 MED ORDER — ONDANSETRON HCL 4 MG/2ML IJ SOLN
4.0000 mg | Freq: Four times a day (QID) | INTRAMUSCULAR | Status: DC | PRN
  Administered 2014-07-17: 4 mg via INTRAVENOUS
  Filled 2014-07-17: qty 2

## 2014-07-17 MED ORDER — EPHEDRINE 5 MG/ML INJ: 10.0000 mg | INTRAVENOUS | Status: DC | PRN

## 2014-07-17 MED ORDER — FENTANYL 2.5 MCG/ML BUPIVACAINE 1/10 % EPIDURAL INFUSION (WH - ANES)
INTRAMUSCULAR | Status: DC | PRN
  Administered 2014-07-17: 14 mL/h via EPIDURAL

## 2014-07-17 MED ORDER — ONDANSETRON HCL 4 MG/2ML IJ SOLN: 4.0000 mg | INTRAMUSCULAR | Status: DC | PRN

## 2014-07-17 MED ORDER — OXYCODONE-ACETAMINOPHEN 5-325 MG PO TABS: 2.0000 | ORAL_TABLET | ORAL | Status: DC | PRN

## 2014-07-17 MED ORDER — LACTATED RINGERS IV SOLN: 500.0000 mL | Freq: Once | INTRAVENOUS | Status: DC

## 2014-07-17 MED ORDER — LIDOCAINE HCL (PF) 1 % IJ SOLN
INTRAMUSCULAR | Status: DC | PRN
  Administered 2014-07-17 (×2): 4 mL

## 2014-07-17 MED ORDER — LANOLIN HYDROUS EX OINT: TOPICAL_OINTMENT | CUTANEOUS | Status: DC | PRN

## 2014-07-17 MED ORDER — BENZOCAINE-MENTHOL 20-0.5 % EX AERO
1.0000 "application " | INHALATION_SPRAY | CUTANEOUS | Status: DC | PRN
  Administered 2014-07-18: 1 via TOPICAL
  Filled 2014-07-17 (×3): qty 56

## 2014-07-17 MED ORDER — OXYTOCIN BOLUS FROM INFUSION
500.0000 mL | INTRAVENOUS | Status: DC
  Administered 2014-07-17: 500 mL via INTRAVENOUS

## 2014-07-17 MED ORDER — TERBUTALINE SULFATE 1 MG/ML IJ SOLN: 0.2500 mg | Freq: Once | INTRAMUSCULAR | Status: DC | PRN

## 2014-07-17 MED ORDER — SENNOSIDES-DOCUSATE SODIUM 8.6-50 MG PO TABS
2.0000 | ORAL_TABLET | ORAL | Status: DC
  Administered 2014-07-18 (×2): 2 via ORAL
  Filled 2014-07-17 (×2): qty 2

## 2014-07-17 MED ORDER — DIBUCAINE 1 % RE OINT
1.0000 "application " | TOPICAL_OINTMENT | RECTAL | Status: DC | PRN
  Filled 2014-07-17: qty 28

## 2014-07-17 MED ORDER — WITCH HAZEL-GLYCERIN EX PADS: 1.0000 "application " | MEDICATED_PAD | CUTANEOUS | Status: DC | PRN

## 2014-07-17 MED ORDER — LACTATED RINGERS IV SOLN: 500.0000 mL | INTRAVENOUS | Status: DC | PRN

## 2014-07-17 MED ORDER — PENICILLIN G POTASSIUM 5000000 UNITS IJ SOLR
2.5000 10*6.[IU] | INTRAVENOUS | Status: DC
  Administered 2014-07-17 (×2): 2.5 10*6.[IU] via INTRAVENOUS
  Filled 2014-07-17 (×5): qty 2.5

## 2014-07-17 MED ORDER — DIPHENHYDRAMINE HCL 25 MG PO CAPS: 25.0000 mg | ORAL_CAPSULE | Freq: Four times a day (QID) | ORAL | Status: DC | PRN

## 2014-07-17 MED ORDER — BUTORPHANOL TARTRATE 1 MG/ML IJ SOLN
1.0000 mg | INTRAMUSCULAR | Status: DC | PRN
  Administered 2014-07-17: 1 mg via INTRAVENOUS
  Filled 2014-07-17: qty 1

## 2014-07-17 NOTE — Anesthesia Preprocedure Evaluation (Addendum)
Anesthesia Evaluation  Patient identified by MRN, date of birth, ID band Patient awake    Reviewed: Allergy & Precautions, NPO status , Patient's Chart, lab work & pertinent test results  History of Anesthesia Complications Negative for: history of anesthetic complications  Airway Mallampati: II  TM Distance: >3 FB Neck ROM: Full    Dental no notable dental hx. (+) Dental Advisory Given   Pulmonary former smoker,  breath sounds clear to auscultation  Pulmonary exam normal       Cardiovascular negative cardio ROS  Rhythm:Regular Rate:Normal     Neuro/Psych  Headaches, negative psych ROS   GI/Hepatic negative GI ROS, Neg liver ROS,   Endo/Other  negative endocrine ROS  Renal/GU negative Renal ROS  negative genitourinary   Musculoskeletal Chronic back pain: reports chronic pain that will intermittently radiate down bilateral lower extremity. She reports occasional numbness and weakness, mostly in bilateral thighs but occasionally all the way down both legs    Abdominal   Peds negative pediatric ROS (+)  Hematology negative hematology ROS (+) anemia ,   Anesthesia Other Findings   Reproductive/Obstetrics negative OB ROS (+) Pregnancy                            Anesthesia Physical Anesthesia Plan  ASA: II  Anesthesia Plan: Epidural   Post-op Pain Management:    Induction:   Airway Management Planned:   Additional Equipment:   Intra-op Plan:   Post-operative Plan:   Informed Consent: I have reviewed the patients History and Physical, chart, labs and discussed the procedure including the risks, benefits and alternatives for the proposed anesthesia with the patient or authorized representative who has indicated his/her understanding and acceptance.   Dental advisory given  Plan Discussed with:   Anesthesia Plan Comments:         Anesthesia Quick Evaluation

## 2014-07-17 NOTE — MAU Note (Signed)
Woke up this morning and felt "wet", had bloody fluid on shorts and underwear. States every times she wipes she sees blood. Had contractions every 30 minutes last night, states this morning she feels "crampy". Hasn't felt baby move since last night. 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 Denies complications with pregnancy.

## 2014-07-17 NOTE — Anesthesia Procedure Notes (Signed)
Epidural Patient location during procedure: OB Start time: 07/17/2014 4:50 PM  Staffing Anesthesiologist: Karie SchwalbeJUDD, Waleed Dettman Performed by: anesthesiologist   Preanesthetic Checklist Completed: patient identified, site marked, surgical consent, pre-op evaluation, timeout performed, IV checked, risks and benefits discussed and monitors and equipment checked  Epidural Patient position: sitting Prep: site prepped and draped and DuraPrep Patient monitoring: continuous pulse ox and blood pressure Approach: midline Location: L3-L4 Injection technique: LOR saline  Needle:  Needle type: Tuohy  Needle gauge: 17 G Needle length: 9 cm and 9 Needle insertion depth: 4 cm Catheter type: closed end flexible Catheter size: 19 Gauge Catheter at skin depth: 9 cm Test dose: negative  Assessment Events: blood not aspirated, injection not painful, no injection resistance, negative IV test and no paresthesia  Additional Notes Patient identified. Risks/Benefits/Options discussed with patient including but not limited to bleeding, infection, nerve damage, paralysis, failed block, incomplete pain control, headache, blood pressure changes, nausea, vomiting, reactions to medication both or allergic, itching and postpartum back pain. Confirmed with bedside nurse the patient's most recent platelet count. Confirmed with patient that they are not currently taking any anticoagulation, have any bleeding history or any family history of bleeding disorders. Patient expressed understanding and wished to proceed. All questions were answered. Sterile technique was used throughout the entire procedure. Please see nursing notes for vital signs. Test dose was given through epidural catheter and negative prior to continuing to dose epidural or start infusion. Warning signs of high block given to the patient including shortness of breath, tingling/numbness in hands, complete motor block, or any concerning symptoms with instructions to  call for help. Patient was given instructions on fall risk and not to get out of bed. All questions and concerns addressed with instructions to call with any issues or inadequate analgesia.

## 2014-07-17 NOTE — MAU Provider Note (Signed)
S:  29 y.o. G1P0 @ @[redacted]w[redacted]d  presents to MAU for rule out rupture of membranes. Leakage of fluid started at 7 am and fluid is light red/pink tinged. She reports regular contractions starting last night and worsening this morning.  She reports good fetal movement, denies vaginal itching/burning, urinary symptoms, h/a, dizziness, n/v, or fever/chills.    Pt has hx of HSV, on suppressive therapy, and denies s/sx of outbreak.  O:  BP 121/74 mmHg  Pulse 84  Temp(Src) 98 F (36.7 C) (Oral)  Resp 18  Ht 5\' 2"  (1.575 m)  Wt 65.59 kg (144 lb 9.6 oz)  BMI 26.44 kg/m2  SpO2 100%  LMP 10/21/2013 Physical Examination: General appearance - alert, well appearing, and in no distress, oriented to person, place, and time and acyanotic, in no respiratory distress  Speculum exam reveals positive pooling of pink fluid.  No HSV lesions noted.    Ferning slide positive  FHR baseline 135 with moderate variability, positive accels, no decels Toco with ctx every 3-4 minutes lasting 60 seconds, mild to moderate to palpation  A: G1P0 @ 7536w1d with SROM  GBS positive  P: RN to call [facility]  LEFTWICH-KIRBY, PhillipsLISA, CNM 8:33 AM

## 2014-07-17 NOTE — H&P (Signed)
29 y.o. G1P0 @ 4860w1d presents with c/o ROM at 0700.  She reports worsening contractions.  Otherwise has good fetal movement and no bleeding.  Has a history of HSV, but no prodromal symptoms.  Has been compliant w valtrex.   Past Medical History  Diagnosis Date  . Back pain, chronic   . STD (sexually transmitted disease)     herpes  . Infection     UTI  . Vaginal Pap smear, abnormal     bx and trx  . 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 Headache(784.0)     Migraines    Past Surgical History  Procedure Laterality Date  . No past surgeries      OB History  Gravida Para Term Preterm AB SAB TAB Ectopic Multiple Living  1             # Outcome Date GA Lbr Len/2nd Weight Sex Delivery Anes PTL Lv  1 Current               History   Social History  . Marital Status: Single    Spouse Name: N/A  . Number of Children: N/A  . Years of Education: N/A   Occupational History  . Not on file.   Social History Main Topics  . Smoking status: Former Games developermoker  . Smokeless tobacco: Never Used     Comment: quit before preg  . Alcohol Use: No     Comment: Has stopped for multiple months  . Drug Use: No  . Sexual Activity: Yes    Birth Control/ Protection: None   Other Topics Concern  . Not on file   Social History Narrative   Shellfish allergy    Prenatal Transfer Tool  Maternal Diabetes: No Genetic Screening: Declined Maternal Ultrasounds/Referrals: Normal Fetal Ultrasounds or other Referrals:  None Maternal Substance Abuse:  No Significant Maternal Medications:  None Significant Maternal Lab Results: Lab values include: Group B Strep positive, Other:  Hemoglobin constant spring (FOB w normal hgb electrophoresis)  ABO, Rh: --/--/B POS (03/18 1025) Antibody: NEG (03/18 1025) Rubella:  Immune  RPR: Nonreactive (03/18 0000)  HBsAg: Negative (03/18 0000)  HIV: Non-reactive (03/18 0000)  GBS: Positive (02/19 0000)    Other PNC: uncomplicated.    Filed  Vitals:   07/17/14 1201  BP: 102/65  Pulse: 83  Temp: 98 F (36.7 C)  Resp: 18     General:  NAD Lungs: CTAB Cardiac: RRR Abdomen:  soft, gravid, EFW 6# Ex:  no edema SSE:  Positive pool, positive fern.  No HSV lesions noted per CNM.  On my exam, 1/50/-2 FHTs:  130s, mod var, + accles Toco:  q4-5 min   A/P   29 y.o. 6760w1d  G1P0 presents with ROM, early labor Epidural upon maternal request, IV pain meds as needed now Pitocin augmentation now, contracting too frequently for cytotec FSR/ vtx/ GBS positive: PCN   Jadin Kagel GEFFEL Paisli Silfies

## 2014-07-18 LAB — CBC
HCT: 34.1 % — ABNORMAL LOW (ref 36.0–46.0)
HEMOGLOBIN: 10.9 g/dL — AB (ref 12.0–15.0)
MCH: 23.8 pg — AB (ref 26.0–34.0)
MCHC: 32 g/dL (ref 30.0–36.0)
MCV: 74.5 fL — ABNORMAL LOW (ref 78.0–100.0)
Platelets: 241 10*3/uL (ref 150–400)
RBC: 4.58 MIL/uL (ref 3.87–5.11)
RDW: 15.7 % — ABNORMAL HIGH (ref 11.5–15.5)
WBC: 19.3 10*3/uL — AB (ref 4.0–10.5)

## 2014-07-18 LAB — RPR: RPR Ser Ql: NONREACTIVE

## 2014-07-18 NOTE — Progress Notes (Signed)
Patient is doing well.  She is ambulating, voiding, tolerating PO.  Pain control is good.  Lochia is appropriate  Filed Vitals:   07/17/14 2133 07/17/14 2206 07/17/14 2320 07/18/14 0555  BP: 119/63 112/81 111/64 106/61  Pulse: 70 73 94 76  Temp: 97.1 F (36.2 C) 98.2 F (36.8 C) 98.1 F (36.7 C) 97.5 F (36.4 C)  TempSrc: Oral Oral Oral Oral  Resp: 18 18 18 18   Height:      Weight:      SpO2:  100% 99% 99%    NAD Fundus firm Ext: no edema  Lab Results  Component Value Date   WBC 19.3* 07/18/2014   HGB 10.9* 07/18/2014   HCT 34.1* 07/18/2014   MCV 74.5* 07/18/2014   PLT 241 07/18/2014    --/--/B POS, B POS (03/18 1025)/RImmune  A/P 28 y.o. G1P1001 PPD#1 s/p TSVD. Routine care.   Expect d/c tomorrow.  Circ as outpatient.     [facility] Hospital[facility]ANNA GEFFEL [employer] Tim[employer] CompanyCLARK

## 2014-07-18 NOTE — Anesthesia Postprocedure Evaluation (Signed)
Anesthesia Post Note  Patient: Kelsey Lynch  Procedure(s) Performed: * No procedures listed *  Anesthesia type: Epidural  Patient location: Mother/Baby  Post pain: Pain level controlled  Post assessment: Post-op Vital signs reviewed  Last Vitals:  Filed Vitals:   07/18/14 0555  BP: 106/61  Pulse: 76  Temp: 36.4 C  Resp: 18    Post vital signs: Reviewed  Level of consciousness:alert  Complications: No apparent anesthesia complications

## 2014-07-18 NOTE — Lactation Note (Signed)
This note was copied from the chart of Kelsey Shanika Feldner. Lactation Consultation Note; Mom reports baby has been nursing great! Reports no pain with latching. No questions at present. BF brochure given with resources for support after DC. Reviewed BFSG and OP appointments as resources after DC. Reviewed cluster feeding and encouraged to rest as much as possible. To call prn   Patient Name: Kelsey Jenene SlickerMarendy Curley BMWUX'LToday's Date: 07/18/2014 Reason for consult: Initial assessment   Maternal Data Formula Feeding for Exclusion: No Does the patient have breastfeeding experience prior to this delivery?: No  Feeding Feeding Type: Breast Fed  LATCH Score/Interventions                      Lactation Tools Discussed/Used     Consult Status Consult Status: PRN    Pamelia HoitWeeks, Gali Spinney D 07/18/2014, 2:52 PM

## 2014-07-19 NOTE — Progress Notes (Signed)
Reevaluation of nipple shield done and patient stated "I still can't tell if its from the earlier injury to my nipple that is causing the discomfort or displacement of the shield". Will have lactation re-evaluate nipple shield placement.

## 2014-07-19 NOTE — Lactation Note (Signed)
This note was copied from the chart of [name]. Lactation Consultation Note:  Mother states that infant latches poorly on the Rt and she feels pain. She denies painful latch on the left. Infant is very sleepy when attempt to latch. A few sucks  on and off on the Rt breast for 15 mins. Infant is able to get good depth. Advised father in assisting with adjusting infants lower jaw and rolling infants upper lip for a wider gape.  Assist parents with waking infant. Infant was spoon fed 1-2 mls of colostrum. Assist mother with latching infant on the left breast. Observed infant with rhythmic suckling and swallows. Observed that infant has a slight upper lip tie and a short tight frenula. infants frenula appears red and as if he may have stretched it while feeding. Mother has large nipples. She is able to hand express good amt of colostrum.  Mother was given a hand pump with instructions and a #27 flange. Advised to feed infant 8-12 times in 24 hours. Mother has an appt with Peds in am for weight check.   Patient Name: Kelsey Lynch [name] WUXLK'GToday's Date: 07/19/2014 Reason for consult: Follow-up assessment   Maternal Data    Feeding Feeding Type: Breast Fed Length of feed: 15 min  LATCH Score/Interventions Latch: Grasps breast easily, tongue down, lips flanged, rhythmical sucking.  Audible Swallowing: Spontaneous and intermittent  Type of Nipple: Everted at rest and after stimulation  Comfort (Breast/Nipple): Soft / non-tender     Hold (Positioning): Assistance needed to correctly position infant at breast and maintain latch. Intervention(s): Support Pillows;Position options;Skin to skin  LATCH Score: 9  Lactation Tools Discussed/Used     Consult Status      Michel BickersKendrick, Roselene Gray McCoy 07/19/2014, 1:18 PM

## 2014-07-19 NOTE — Progress Notes (Signed)
Patient is eating, ambulating, voiding.  Pain control is good.  Filed Vitals:   07/17/14 2320 07/18/14 0555 07/18/14 1845 07/19/14 0541  BP: 111/64 106/61 110/63 104/57  Pulse: 94 76 74 76  Temp: 98.1 F (36.7 C) 97.5 F (36.4 C) 99.1 F (37.3 C) 98.2 F (36.8 C)  TempSrc: Oral Oral Oral Oral  Resp: 18 18 18 18   Height:      Weight:      SpO2: 99% 99%      Fundus firm Perineum without swelling.  Lab Results  Component Value Date   WBC 19.3* 07/18/2014   HGB 10.9* 07/18/2014   HCT 34.1* 07/18/2014   MCV 74.5* 07/18/2014   PLT 241 07/18/2014    --/--/B POS, B POS (03/18 1025)/RI  A/P Post partum day 2.  Routine care.  Expect d/c today.    Yoseline Andersson A

## 2014-07-19 NOTE — Discharge Summary (Signed)
Obstetric Discharge Summary Reason for Admission: onset of labor Prenatal Procedures: none Intrapartum Procedures: spontaneous vaginal delivery Postpartum Procedures: none Complications-Operative and Postpartum: vaginal laceration HEMOGLOBIN  Date Value Ref Range Status  07/18/2014 10.9* 12.0 - 15.0 g/dL Final   HCT  Date Value Ref Range Status  07/18/2014 34.1* 36.0 - 46.0 % Final    Discharge Diagnoses: Term Pregnancy-delivered  Discharge Information: Date: 07/19/2014 Activity: pelvic rest Diet: routine Medications: Ibuprofen Condition: stable Instructions: refer to practice specific booklet Discharge to: home Follow-up Information    [facility] Chestine SporeLARK, MD In 4 weeks.   Specialty:  Obstetrics   Contact information:   [address] [place] [address] [phone]       Newborn Data: Live born female  Birth Weight: 6 lb 6.8 oz (2914 g) APGAR: 8, 9  Home with mother.  Kelsey Lynch A 07/19/2014, 9:04 AM

## 2014-07-19 NOTE — Progress Notes (Signed)
MD Henderson CloudHorvath was notified about rash on patient's back.  MD advised nurse to tell patient to take Benadryl when she gets home and to DC patient.

## 2014-07-21 NOTE — Progress Notes (Signed)
Post discharge chart review completed.  

## 2014-09-16 ENCOUNTER — Other Ambulatory Visit: Payer: Self-pay | Admitting: Family Medicine

## 2014-09-16 ENCOUNTER — Ambulatory Visit
Admission: RE | Admit: 2014-09-16 | Discharge: 2014-09-16 | Disposition: A | Discharge status: Home / Self Care | Payer: Medicaid Other | Source: Ambulatory Visit | Attending: Family Medicine | Admitting: Family Medicine

## 2014-09-16 DIAGNOSIS — M5489 Other dorsalgia: Secondary | ICD-10-CM

## 2014-10-19 ENCOUNTER — Emergency Department (HOSPITAL_COMMUNITY)
Admission: EM | Admit: 2014-10-19 | Discharge: 2014-10-19 | Disposition: A | Discharge status: Home / Self Care | Payer: Medicaid Other | Attending: Emergency Medicine | Admitting: Emergency Medicine

## 2014-10-19 ENCOUNTER — Encounter (HOSPITAL_COMMUNITY): Payer: Self-pay | Admitting: *Deleted

## 2014-10-19 DIAGNOSIS — H5711 Ocular pain, right eye: Secondary | ICD-10-CM | POA: Diagnosis present

## 2014-10-19 DIAGNOSIS — H00031 Abscess of right upper eyelid: Secondary | ICD-10-CM | POA: Insufficient documentation

## 2014-10-19 DIAGNOSIS — G8929 Other chronic pain: Secondary | ICD-10-CM | POA: Diagnosis not present

## 2014-10-19 DIAGNOSIS — Z8619 Personal history of other infectious and parasitic diseases: Secondary | ICD-10-CM | POA: Diagnosis not present

## 2014-10-19 DIAGNOSIS — L03213 Periorbital cellulitis: Secondary | ICD-10-CM

## 2014-10-19 DIAGNOSIS — Z87442 Personal history of urinary calculi: Secondary | ICD-10-CM | POA: Insufficient documentation

## 2014-10-19 DIAGNOSIS — Z87891 Personal history of nicotine dependence: Secondary | ICD-10-CM | POA: Diagnosis not present

## 2014-10-19 DIAGNOSIS — G43809 Other migraine, not intractable, without status migrainosus: Secondary | ICD-10-CM

## 2014-10-19 MED ORDER — PROCHLORPERAZINE EDISYLATE 5 MG/ML IJ SOLN
10.0000 mg | Freq: Once | INTRAMUSCULAR | Status: AC
  Administered 2014-10-19: 10 mg via INTRAMUSCULAR
  Filled 2014-10-19: qty 2

## 2014-10-19 MED ORDER — FLUORESCEIN SODIUM 1 MG OP STRP
1.0000 | ORAL_STRIP | Freq: Once | OPHTHALMIC | Status: AC
  Administered 2014-10-19: 1 via OPHTHALMIC
  Filled 2014-10-19: qty 1

## 2014-10-19 MED ORDER — CLINDAMYCIN HCL 150 MG PO CAPS: 300.0000 mg | ORAL_CAPSULE | Freq: Four times a day (QID) | ORAL | Status: DC

## 2014-10-19 MED ORDER — DIPHENHYDRAMINE HCL 50 MG/ML IJ SOLN
25.0000 mg | Freq: Once | INTRAMUSCULAR | Status: AC
  Administered 2014-10-19: 25 mg via INTRAMUSCULAR
  Filled 2014-10-19: qty 1

## 2014-10-19 MED ORDER — KETOROLAC TROMETHAMINE 60 MG/2ML IM SOLN
60.0000 mg | Freq: Once | INTRAMUSCULAR | Status: AC
  Administered 2014-10-19: 60 mg via INTRAMUSCULAR
  Filled 2014-10-19: qty 2

## 2014-10-19 NOTE — ED Provider Notes (Signed)
CSN: [id number]     Arrival date & time 10/19/14  1312 History   First MD Initiated Contact with Patient 10/19/14 1657     Chief Complaint  Patient presents with  . Eye Pain  . Migraine     (Consider location/radiation/quality/duration/timing/severity/associated sxs/prior Treatment) HPI Comments: Patient presents to the ER for evaluation of pain and swelling of her right eye. Patient reports that she noticed last night that her right eyelid was swollen. She reports that at one point last night swelling was somewhat she couldn't open her eye. This has significantly improved today, but she still has discomfort in the eye. She does normally wear contacts, is not currently wearing her contacts. She reports that she has a history of migraine headaches. She has been having intermittent migraine headache this week, the headache has returned and is moderately severe pounding diffusely across the front of her head, similar to previous migraines. No fever, no neck pain or stiffness.  Patient is a 29 y.o. female presenting with eye pain and migraines.  Eye Pain Associated symptoms include headaches.  Migraine Associated symptoms include headaches.    Past Medical History  Diagnosis Date  . Back pain, chronic   . STD (sexually transmitted disease)     herpes  . Infection     UTI  . Vaginal Pap smear, abnormal     bx and trx  . 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 Headache(784.0)     Migraines   Past Surgical History  Procedure Laterality Date  . No past surgeries     Family History  Problem Relation Age of Onset  . Diabetes Paternal Grandmother    History  Substance Use Topics  . Smoking status: Former Games developer  . Smokeless tobacco: Never Used     Comment: quit before preg  . Alcohol Use: No     Comment: Has stopped for multiple months   OB History    Gravida Para Term Preterm AB TAB SAB Ectopic Multiple Living   1 1 1       0 1     Review of Systems  Eyes:  Positive for pain. Negative for visual disturbance.  Neurological: Positive for headaches.  All other systems reviewed and are negative.     Allergies  Shellfish allergy  Home Medications   Prior to Admission medications   Medication Sig Start Date End Date Taking? Authorizing Provider  aspirin-acetaminophen-caffeine (EXCEDRIN MIGRAINE) [phone] MG per tablet Take 1 tablet by mouth every 6 (six) hours as needed for headache (headache).   Yes Historical Provider, MD  ibuprofen (ADVIL,MOTRIN) 200 MG tablet Take 600 mg by mouth every 6 (six) hours as needed (migraine).   Yes Historical Provider, MD   BP 115/68 mmHg  Pulse 82  Temp(Src) 98.2 F (36.8 C) (Oral)  Resp 16  SpO2 100%  LMP 10/04/2014 Physical Exam  Constitutional: She is oriented to person, place, and time. She appears well-developed and well-nourished. No distress.  HENT:  Head: Normocephalic and atraumatic.  Right Ear: Hearing normal.  Left Ear: Hearing normal.  Nose: Nose normal.  Mouth/Throat: Oropharynx is clear and moist and mucous membranes are normal.  Eyes: Conjunctivae and EOM are normal. Pupils are equal, round, and reactive to light.  Slit lamp exam:      The right eye shows no fluorescein uptake (woods lamp exam).  Faint erythema and edema right upper eyelid  Neck: Normal range of motion. Neck supple.  Cardiovascular: Regular  rhythm, S1 normal and S2 normal.  Exam reveals no gallop and no friction rub.   No murmur heard. Pulmonary/Chest: Effort normal and breath sounds normal. No respiratory distress. She exhibits no tenderness.  Abdominal: Soft. Normal appearance and bowel sounds are normal. There is no hepatosplenomegaly. There is no tenderness. There is no rebound, no guarding, no tenderness at McBurney's point and negative Murphy's sign. No hernia.  Musculoskeletal: Normal range of motion.  Neurological: She is alert and oriented to person, place, and time. She has normal strength. No cranial nerve  deficit or sensory deficit. Coordination normal. GCS eye subscore is 4. GCS verbal subscore is 5. GCS motor subscore is 6.  Skin: Skin is warm, dry and intact. No rash noted. No cyanosis.  Psychiatric: She has a normal mood and affect. Her speech is normal and behavior is normal. Thought content normal.  Nursing note and vitals reviewed.   ED Course  Procedures (including critical care time) Labs Review Labs Reviewed - No data to display  Imaging Review No results found.   EKG Interpretation None      MDM   Final diagnoses:  None   migraine headache  Preseptal cellulitis  Patient presents to the ER for evaluation of pain and swelling around the right eye. She has faint erythema and edema of the upper eyelid. There is no proptosis. There is no pain with extraocular muscle movement, this does not resemble orbital cellulitis. She is well-appearing and afebrile. Symptoms might be allergic, but cannot rule out infection and will need to cover for cellulitis.  Patient does wear contacts. Fluorescein dye and Wood's lamp exam, however, was negative. No evidence of corneal ulcer or abrasion, no dendritic lesions. She does not have any vision changes.  Patient experiencing headache. She has a history of recurrent migraine headaches. The headache she is experiencing today is similar to previous headaches, no unusual features. She has no observable neurologic deficits. Patient does not require imaging or workup for her headache which is similar to previous chronic headaches. She was treated as a migraine.    Gilda Crease, MD 10/19/14 [phone]

## 2014-10-19 NOTE — Discharge Instructions (Signed)
Migraine Headache A migraine headache is very bad, throbbing pain on one or both sides of your head. Talk to your doctor about what things may bring on (trigger) your migraine headaches. HOME CARE  Only take medicines as told by your doctor.  Lie down in a dark, quiet room when you have a migraine.  Keep a journal to find out if certain things bring on migraine headaches. For example, write down:  What you eat and drink.  How much sleep you get.  Any change to your diet or medicines.  Lessen how much alcohol you drink.  Quit smoking if you smoke.  Get enough sleep.  Lessen any stress in your life.  Keep lights dim if bright lights bother you or make your migraines worse. GET HELP RIGHT AWAY IF:   Your migraine becomes really bad.  You have a fever.  You have a stiff neck.  You have trouble seeing.  Your muscles are weak, or you lose muscle control.  You lose your balance or have trouble walking.  You feel like you will pass out (faint), or you pass out.  You have really bad symptoms that are different than your first symptoms. MAKE SURE YOU:   Understand these instructions.  Will watch your condition.  Will get help right away if you are not doing well or get worse. Document Released: 01/25/2008 Document Revised: 07/10/2011 Document Reviewed: 12/23/2012 [facility] Patient Information 2015 [place], [place]. This information is not intended to replace advice given to you by your health care provider. Make sure you discuss any questions you have with your health care provider.  Periorbital Cellulitis Periorbital cellulitis is a common infection that can affect the eyelid and the soft tissues that surround the eyeball. The infection may also affect the structures that produce and drain tears. It does not affect the eyeball itself. Natural tissue barriers usually prevent the spread of this infection to the eyeball and other deeper areas of the eye socket.   CAUSES  Bacterial infection.  Long-term (chronic) sinus infections.  An object (foreign body) stuck behind the eye.  An injury that goes through the eyelid tissues.  An injury that causes an infection, such as an insect sting.  Fracture of the bone around the eye.  Infections which have spread from the eyelid or other structures around the eye.  Bite wounds.  Inflammation or infection of the lining membranes of the brain (meningitis).  An infection in the blood (septicemia).  Dental infection (abscess).  Viral infection (this is rare). SYMPTOMS Symptoms usually come on suddenly.  Pain in the eye.  Red, hot, and swollen eyelids and possibly cheeks. The swelling is sometimes bad enough that the eyelids cannot open. Some infections make the eyelids look purple.  Fever and feeling generally ill.  Pain when touching the area around the eye. DIAGNOSIS  Periorbital cellulitis can be diagnosed from an eye exam. In severe cases, your caregiver might suggest:  Blood tests.  Imaging tests (such as a CT scan) to examine the sinuses and the area around and behind the eyeball. TREATMENT If your caregiver feels that you do not have any signs of serious infection, treatment may include:  Antibiotics.  Nasal decongestants to reduce swelling.  Referral to a dentist if it is suspected that the infection was caused by a prior tooth infection.  Examination every day to make sure the problem is improving. HOME CARE INSTRUCTIONS  Take your antibiotics as directed. Finish them even if you start to  feel better.  Some pain is normal with this condition. Take pain medicine as directed by your caregiver. Only take pain medicines approved by your caregiver.  It is important to drink fluids. Drink enough water and fluids to keep your urine clear or pale yellow.  Do not smoke.  Rest and get plenty of sleep.  Mild or moderate fevers generally have no long-term effects and often do  not require treatment.  If your caregiver has given you a follow-up appointment, it is very important to keep that appointment. Your caregiver will need to make sure that the infection is getting better. It is important to check that a more serious infection is not developing. SEEK IMMEDIATE MEDICAL CARE IF:  Your eyelids become more painful, red, warm, or swollen.  You develop double vision or your vision becomes blurred or worsens in any way.  You have trouble moving your eyes.  The eye looks like it is popping out (proptosis).  You develop a severe headache, severe neck pain, or neck stiffness.  You develop repeated vomiting.  You have a fever or persistent symptoms for more than 72 hours.  You have a fever and your symptoms suddenly get worse. MAKE SURE YOU:  Understand these instructions.  Will watch your condition.  Will get help right away if you are not doing well or get worse. Document Released: 05/20/2010 Document Revised: 07/10/2011 Document Reviewed: 05/20/2010 [facility] Patient Information 2015 [place], [place]. This information is not intended to replace advice given to you by your health care provider. Make sure you discuss any questions you have with your health care provider.

## 2014-10-19 NOTE — ED Notes (Signed)
Pt complains of pain and swelling in her left eye since last night, pt states the pain became worse this morning and is now giving her a headache. Pt took 600mg  ibuprofen with no relief.

## 2014-10-19 NOTE — ED Notes (Signed)
Placed Woods lamp and Fluorescein strip at bedside.

## 2014-10-23 ENCOUNTER — Emergency Department (HOSPITAL_COMMUNITY)
Admission: EM | Admit: 2014-10-23 | Discharge: 2014-10-23 | Disposition: A | Discharge status: Home / Self Care | Payer: Medicaid Other | Attending: Emergency Medicine | Admitting: Emergency Medicine

## 2014-10-23 ENCOUNTER — Encounter (HOSPITAL_COMMUNITY): Payer: Self-pay | Admitting: Emergency Medicine

## 2014-10-23 DIAGNOSIS — T63304A Toxic effect of unspecified spider venom, undetermined, initial encounter: Secondary | ICD-10-CM

## 2014-10-23 DIAGNOSIS — Z792 Long term (current) use of antibiotics: Secondary | ICD-10-CM | POA: Insufficient documentation

## 2014-10-23 DIAGNOSIS — Z87891 Personal history of nicotine dependence: Secondary | ICD-10-CM | POA: Diagnosis not present

## 2014-10-23 DIAGNOSIS — Z8619 Personal history of other infectious and parasitic diseases: Secondary | ICD-10-CM | POA: Diagnosis not present

## 2014-10-23 DIAGNOSIS — G8929 Other chronic pain: Secondary | ICD-10-CM | POA: Insufficient documentation

## 2014-10-23 DIAGNOSIS — Z87440 Personal history of urinary (tract) infections: Secondary | ICD-10-CM | POA: Diagnosis not present

## 2014-10-23 DIAGNOSIS — X58XXXA Exposure to other specified factors, initial encounter: Secondary | ICD-10-CM | POA: Diagnosis not present

## 2014-10-23 DIAGNOSIS — Y998 Other external cause status: Secondary | ICD-10-CM | POA: Diagnosis not present

## 2014-10-23 DIAGNOSIS — G43909 Migraine, unspecified, not intractable, without status migrainosus: Secondary | ICD-10-CM | POA: Insufficient documentation

## 2014-10-23 DIAGNOSIS — Y9389 Activity, other specified: Secondary | ICD-10-CM | POA: Diagnosis not present

## 2014-10-23 DIAGNOSIS — Y9289 Other specified places as the place of occurrence of the external cause: Secondary | ICD-10-CM | POA: Insufficient documentation

## 2014-10-23 DIAGNOSIS — T63311A Toxic effect of venom of black widow spider, accidental (unintentional), initial encounter: Secondary | ICD-10-CM | POA: Insufficient documentation

## 2014-10-23 MED ORDER — IBUPROFEN 800 MG PO TABS: 800.0000 mg | ORAL_TABLET | Freq: Three times a day (TID) | ORAL | Status: DC

## 2014-10-23 NOTE — ED Notes (Signed)
Pt reports being bitten by a spider last night. Unsure of the type/color. Was bitten on the left shoulder. Felt dizzy and lightheaded last night so she laid down and took 3 Ibuprofen without alleviation. Took 2 Ibuprofen at 0900 this morning without alleviation. Has swollen/reddened/warm area on right shoulder. No streaking red lines from initial bite. C/o pain and soreness at site and says, "my arm feels weird."

## 2014-10-23 NOTE — Discharge Instructions (Signed)
Spider Bite Spider bites are not common. Most spider bites do not cause serious problems. The elderly, very young children, and people with certain existing medical conditions are more likely to experience significant symptoms. SYMPTOMS  Spider bites may not cause any pain at first. Within 1 or 2 days of the bite, there may be swelling, redness, and pain in the bite area. However, some spider bites can cause pain within the first hour. TREATMENT  Your caregiver may prescribe antibiotic medicine if a bacterial infection develops in the bite. However, not all spider bites require antibiotics or prescription medicines.  HOME CARE INSTRUCTIONS  Do not scratch the bite area.  Keep the bite area clean and dry. Wash the area with soap and water as directed.  Put ice or cool compresses on the bite area.  Put ice in a plastic bag.  Place a towel between your skin and the bag.  Leave the ice on for 20 minutes, 4 times a day for the first 2 to 3 days, or as directed.  Keep the bite area elevated above the level of your heart. This helps reduce redness and swelling.  Only take over-the-counter or prescription medicines as directed by your caregiver.  If you are given antibiotics, take them as directed. Finish them even if you start to feel better. You may need a tetanus shot if:  You cannot remember when you had your last tetanus shot.  You have never had a tetanus shot.  The injury broke your skin. If you get a tetanus shot, your arm may swell, get red, and feel warm to the touch. This is common and not a problem. If you need a tetanus shot and you choose not to have one, there is a rare chance of getting tetanus. Sickness from tetanus can be serious. SEEK MEDICAL CARE IF: Your bite is not better after 3 days of treatment. SEEK IMMEDIATE MEDICAL CARE IF:  Your bite turns purple or develops increased swelling, pain, or redness.  You develop shortness of breath or chest pain.  You have  muscle cramps or painful muscle spasms.  You develop abdominal pain, nausea, or vomiting.  You feel unusually tired or sleepy. MAKE SURE YOU:  Understand these instructions.  Will watch your condition.  Will get help right away if you are not doing well or get worse. Document Released: 05/25/2004 Document Revised: 07/10/2011 Document Reviewed: 11/16/2010 [employer] Patient Information 2015 [employer]. This information is not intended to replace advice given to you by your health care provider. Make sure you discuss any questions you have with your health care provider.  

## 2014-10-23 NOTE — ED Provider Notes (Signed)
CSN: [id number]     Arrival date & time 10/23/14  1927 History  This chart was scribed for non-physician practitioner, Elpidio Anis, PA-C, working with Elwin Mocha, MD by Bethel Born, ED Scribe. This patient was seen in room WTR7/WTR7 and the patient's care was started at 8:21 PM.    Chief Complaint  Patient presents with  . Insect Bite     The history is provided by the patient. No language interpreter was used.   Kelsey Lynch is a 29 y.o. female who presents to the Emergency Department complaining of a spider bite with onset last night. Pt reports being bitten by a small black spider on her left shoulder. Associated redness and constant pain at the site of the bite, left arm pain, tingling in the left arm, dizziness that resolved last night, and nausea. Pt rates the pain 10/10 in severity at worse. Ibuprofen provided insufficient pain relief PTA. Pt denies fever and vomiting.   Past Medical History  Diagnosis Date  . Back pain, chronic   . STD (sexually transmitted disease)     herpes  . Infection     UTI  . Vaginal Pap smear, abnormal     bx and trx  . 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 Headache(784.0)     Migraines   Past Surgical History  Procedure Laterality Date  . No past surgeries     Family History  Problem Relation Age of Onset  . Diabetes Paternal Grandmother    History  Substance Use Topics  . Smoking status: Former Games developer  . Smokeless tobacco: Never Used     Comment: quit before preg  . Alcohol Use: No     Comment: Has stopped for multiple months   OB History    Gravida Para Term Preterm AB TAB SAB Ectopic Multiple Living   1 1 1       0 1     Review of Systems  Constitutional: Negative for fever.  Gastrointestinal: Positive for nausea. Negative for vomiting.  Musculoskeletal:       Area of redness and pain at the left shoulder Left arm pain and tingling  Skin: Positive for color change.  Neurological: Positive for dizziness.       Allergies  Shellfish allergy  Home Medications   Prior to Admission medications   Medication Sig Start Date End Date Taking? Authorizing Provider  aspirin-acetaminophen-caffeine (EXCEDRIN MIGRAINE) [phone] MG per tablet Take 1 tablet by mouth every 6 (six) hours as needed for headache (headache).   Yes Historical Provider, MD  ibuprofen (ADVIL,MOTRIN) 200 MG tablet Take 600 mg by mouth every 6 (six) hours as needed (migraine).   Yes Historical Provider, MD  clindamycin (CLEOCIN) 150 MG capsule Take 2 capsules (300 mg total) by mouth 4 (four) times daily. Patient not taking: Reported on 10/23/2014 10/19/14   Gilda Crease, MD   BP 108/72 mmHg  Pulse 68  Temp(Src) 98.5 F (36.9 C) (Oral)  Resp 17  SpO2 100%  LMP 10/04/2014 (Exact Date)  Breastfeeding? No Physical Exam  Constitutional: She is oriented to person, place, and time. She appears well-developed and well-nourished. No distress.  HENT:  Head: Normocephalic and atraumatic.  Eyes: Conjunctivae and EOM are normal.  Neck: Neck supple. No tracheal deviation present.  Cardiovascular: Normal rate.   Pulmonary/Chest: Effort normal. No respiratory distress.  Musculoskeletal: Normal range of motion.  Left shoulder- 3x4 cm area of redness without ulceration, blister, or wound. Distal  pulses intact ROM intact  Neurological: She is alert and oriented to person, place, and time.  Skin: Skin is warm and dry.  Psychiatric: She has a normal mood and affect. Her behavior is normal.  Nursing note and vitals reviewed.   ED Course  Procedures (including critical care time)  Coordination of Care 8:21 PM Discussed treatment plan with pt at bedside and pt agreed to plan.  Labs Review Labs Reviewed - No data to display  Imaging Review No results found.   EKG Interpretation None      MDM   Final diagnoses:  None   1. Spider bite  Doubt black widow without signs of neurotoxic symptoms on delayed  presentation. VSS. Suspect pain and swelling due to inflammatory response requiring symptomatic treatment.  I personally performed the services described in this documentation, which was scribed in my presence. The recorded information has been reviewed and is accurate.     Elpidio Anis, PA-C 10/23/14 2254  Elwin Mocha, MD 10/24/14 0100

## 2014-10-23 NOTE — ED Notes (Signed)
Questions r/t dc denied. Pt ambulatory and a&ox4 

## 2015-01-23 ENCOUNTER — Encounter (HOSPITAL_COMMUNITY): Payer: Self-pay | Admitting: Emergency Medicine

## 2015-01-23 ENCOUNTER — Emergency Department (HOSPITAL_COMMUNITY)
Admission: EM | Admit: 2015-01-23 | Discharge: 2015-01-24 | Disposition: A | Discharge status: Home Health | Payer: Medicaid Other | Attending: Emergency Medicine | Admitting: Emergency Medicine

## 2015-01-23 DIAGNOSIS — M79674 Pain in right toe(s): Secondary | ICD-10-CM | POA: Diagnosis present

## 2015-01-23 DIAGNOSIS — Z880 Allergy status to penicillin: Secondary | ICD-10-CM | POA: Insufficient documentation

## 2015-01-23 DIAGNOSIS — G43909 Migraine, unspecified, not intractable, without status migrainosus: Secondary | ICD-10-CM | POA: Insufficient documentation

## 2015-01-23 DIAGNOSIS — L600 Ingrowing nail: Secondary | ICD-10-CM | POA: Diagnosis not present

## 2015-01-23 DIAGNOSIS — G8929 Other chronic pain: Secondary | ICD-10-CM | POA: Diagnosis not present

## 2015-01-23 DIAGNOSIS — Z87440 Personal history of urinary (tract) infections: Secondary | ICD-10-CM | POA: Insufficient documentation

## 2015-01-23 DIAGNOSIS — Z8619 Personal history of other infectious and parasitic diseases: Secondary | ICD-10-CM | POA: Insufficient documentation

## 2015-01-23 DIAGNOSIS — Z87891 Personal history of nicotine dependence: Secondary | ICD-10-CM | POA: Diagnosis not present

## 2015-01-23 NOTE — ED Notes (Signed)
Pt states she's had severe pain d/t an ingrown toe nail. Had it cut at the nail salon yesterday morning (9/23), then pain became much worse that evening and into today. States pain is 10/10 when "something's pushing on it like its in a shoe", but 8/10 with nothing on it. Denies trauma to same.

## 2015-01-24 MED ORDER — OXYCODONE-ACETAMINOPHEN 5-325 MG PO TABS
1.0000 | ORAL_TABLET | Freq: Once | ORAL | Status: AC
  Administered 2015-01-24: 1 via ORAL
  Filled 2015-01-24: qty 1

## 2015-01-24 NOTE — Discharge Instructions (Signed)
Take Clindamycin as prescribed to your by your doctor. Take ibuprofen  every 6 hours and Tylenol #3 for severe pain. Soak your toe/foot as instructed below. You may try MoleSkin to prevent pressure from being applied to the area of pain. Follow up with podiatry.  Ingrown Toenail An ingrown toenail occurs when the sharp edge of your toenail grows into the skin. Causes of ingrown toenails include toenails clipped too far back or poorly fitting shoes. Activities involving sudden stops (basketball, tennis) causing "toe jamming" may lead to an ingrown nail. HOME CARE INSTRUCTIONS   Soak the whole foot in warm soapy water for 20 minutes, 3 times per day.  You may lift the edge of the nail away from the sore skin by wedging a small piece of cotton under the corner of the nail. Be careful not to dig (traumatize) and cause more injury to the area.  Wear shoes that fit well. While the ingrown nail is causing problems, sandals may be beneficial.  Trim your toenails regularly and carefully. Cut your toenails straight across, not in a curve. This will prevent injury to the skin at the corners of the toenail.  Keep your feet clean and dry.  Crutches may be helpful early in treatment if walking is painful.  Antibiotics, if prescribed, should be taken as directed.  Return for a wound check in 2 days or as directed.  Only take over-the-counter or prescription medicines for pain, discomfort, or fever as directed by your caregiver. SEEK IMMEDIATE MEDICAL CARE IF:   You have a fever.  You have increasing pain, redness, swelling, or heat at the wound site.  Your toe is not better in 7 days. If conservative treatment is not successful, surgical removal of a portion or all of the nail may be necessary. MAKE SURE YOU:   Understand these instructions.  Will watch your condition.  Will get help right away if you are not doing well or get worse. Document Released: 04/14/2000 Document Revised:  07/10/2011 Document Reviewed: 04/08/2008 [facility] Patient Information 2015 [place], [place]. This information is not intended to replace advice given to you by your health care provider. Make sure you discuss any questions you have with your health care provider.

## 2015-01-24 NOTE — ED Notes (Signed)
Pt c/o 8/10 pain in right great toe due to ingrown toenail.  PA recommends follow up with podiatrist for treatment.  Pt denies additional concerns at this time.  NAD.

## 2015-01-24 NOTE — ED Provider Notes (Signed)
CSN: [id number]     Arrival date & time 01/23/15  2322 History   First MD Initiated Contact with Patient 01/24/15 0047     Chief Complaint  Patient presents with  . Toe Pain     (Consider location/radiation/quality/duration/timing/severity/associated sxs/prior Treatment) HPI Comments: 29 year old female with a history of chronic back pain and migraine headaches presents to the emergency department for pain to her right great toe. Patient states that symptoms have been worsening over the last day. She rates the pain at 10/10. Pain is throbbing in nature. Patient states pain is worse when pressure is applied to the area of pain. She has been taking Tylenol with Codeine for pain without relief. Patient is also currently on clindamycin prophylactically for dental pain. She denies any pus draining from her toe or any fever. Patient reports that she was at a nail salon yesterday and they tried to clip the toenail, but this provided no relie  Patient is a 29 y.o. female presenting with toe pain. The history is provided by the patient. No language interpreter was used.  Toe Pain Associated symptoms include arthralgias. Pertinent negatives include no fever or numbness.    Past Medical History  Diagnosis Date  . Back pain, chronic   . STD (sexually transmitted disease)     herpes  . Infection     UTI  . Vaginal Pap smear, abnormal     bx and trx  . 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 Headache(784.0)     Migraines   Past Surgical History  Procedure Laterality Date  . No past surgeries     Family History  Problem Relation Age of Onset  . Diabetes Paternal Grandmother    Social History  Substance Use Topics  . Smoking status: Former Games developer  . Smokeless tobacco: Never Used     Comment: quit before preg  . Alcohol Use: No     Comment: Has stopped for multiple months   OB History    Gravida Para Term Preterm AB TAB SAB Ectopic Multiple Living   0 1       Review of Systems  Constitutional: Negative for fever.  Musculoskeletal: Positive for arthralgias.  Neurological: Negative for numbness.  All other systems reviewed and are negative.   Allergies  Shellfish allergy and Penicillins  Home Medications   Prior to Admission medications   Medication Sig Start Date End Date Taking? Authorizing Provider  aspirin-acetaminophen-caffeine (EXCEDRIN MIGRAINE) [phone] MG per tablet Take 1 tablet by mouth every 6 (six) hours as needed for headache (headache).    Historical Provider, MD  clindamycin (CLEOCIN) 150 MG capsule Take 2 capsules (300 mg total) by mouth 4 (four) times daily. Patient not taking: Reported on 10/23/2014 10/19/14   Gilda Crease, MD  ibuprofen (ADVIL,MOTRIN) 800 MG tablet Take 1 tablet (800 mg total) by mouth 3 (three) times daily. 10/23/14   Shari Upstill, PA-C   BP 126/70 mmHg  Pulse 64  Temp(Src) 98.3 F (36.8 C) (Oral)  Resp 16  SpO2 100%  LMP 01/23/2015 (Exact Date)   Physical Exam  Constitutional: She is oriented to person, place, and time. She appears well-developed and well-nourished. No distress.  HENT:  Head: Normocephalic and atraumatic.  Eyes: Conjunctivae and EOM are normal. No scleral icterus.  Neck: Normal range of motion.  Cardiovascular: Normal rate, regular rhythm and intact distal pulses.   DP and PT pulses 2+  in the right lower extremity. Capillary refill brisk in all digits of the R foot.  Pulmonary/Chest: Effort normal. No respiratory distress.  Musculoskeletal: Normal range of motion.       Right foot: There is tenderness. There is normal range of motion, no bony tenderness, no swelling, normal capillary refill, no deformity and no laceration.       Feet:  TTP to medial distal R great toe. No fluctuance or purulence. No deformity.  Neurological: She is alert and oriented to person, place, and time. She exhibits normal muscle tone. Coordination normal.  Sensation to light touch  intact. Patient able to wiggle all toes.  Skin: Skin is warm and dry. No rash noted. She is not diaphoretic. No pallor.  Mild erythema to the medial corner of the distal right great toe. No purulence or heat to touch. No red linear streaking.  Psychiatric: She has a normal mood and affect. Her behavior is normal.  Nursing note and vitals reviewed.   ED Course  Procedures (including critical care time) Labs Review Labs Reviewed - No data to display  Imaging Review No results found.   I have personally reviewed and evaluated these images and lab results as part of my medical decision-making.   EKG Interpretation None      MDM   Final diagnoses:  Ingrown right big toenail    29 year old female presents to the emergency department with symptoms consistent with a noninfected ingrown toenail of the right great toe. Patient is neurovascularly intact. She is currently on clindamycin for her wisdom teeth. She has Tylenol with codeine that she has been taking for pain. Have advised warm soaks as well as podiatry follow-up. No indication for further emergent workup at this time. No evidence of paronychia or felon. Patient stable for discharge. Return precautions given and patient with no unaddressed concerns.   Filed Vitals:   01/23/15 2338  BP: 126/70  Pulse: 64  Temp: 98.3 F (36.8 C)  TempSrc: Oral  Resp: 16  SpO2: 100%     Antony Madura, PA-C 01/24/15 0112  Devoria Albe, MD 01/24/15 6644

## 2015-04-13 ENCOUNTER — Emergency Department (HOSPITAL_COMMUNITY)
Admission: EM | Admit: 2015-04-13 | Discharge: 2015-04-13 | Disposition: A | Discharge status: Home / Self Care | Payer: Medicaid Other | Attending: Emergency Medicine | Admitting: Emergency Medicine

## 2015-04-13 ENCOUNTER — Emergency Department (HOSPITAL_COMMUNITY): Payer: Medicaid Other

## 2015-04-13 ENCOUNTER — Encounter (HOSPITAL_COMMUNITY): Payer: Self-pay | Admitting: Emergency Medicine

## 2015-04-13 DIAGNOSIS — Z87440 Personal history of urinary (tract) infections: Secondary | ICD-10-CM | POA: Insufficient documentation

## 2015-04-13 DIAGNOSIS — Z3202 Encounter for pregnancy test, result negative: Secondary | ICD-10-CM | POA: Insufficient documentation

## 2015-04-13 DIAGNOSIS — Z87891 Personal history of nicotine dependence: Secondary | ICD-10-CM | POA: Insufficient documentation

## 2015-04-13 DIAGNOSIS — Z8619 Personal history of other infectious and parasitic diseases: Secondary | ICD-10-CM | POA: Diagnosis not present

## 2015-04-13 DIAGNOSIS — R102 Pelvic and perineal pain: Secondary | ICD-10-CM | POA: Diagnosis not present

## 2015-04-13 DIAGNOSIS — Z793 Long term (current) use of hormonal contraceptives: Secondary | ICD-10-CM | POA: Diagnosis not present

## 2015-04-13 DIAGNOSIS — G8929 Other chronic pain: Secondary | ICD-10-CM | POA: Insufficient documentation

## 2015-04-13 DIAGNOSIS — Z880 Allergy status to penicillin: Secondary | ICD-10-CM | POA: Diagnosis not present

## 2015-04-13 LAB — CBC
HCT: 43.7 % (ref 36.0–46.0)
Hemoglobin: 14.2 g/dL (ref 12.0–15.0)
MCH: 24.8 pg — ABNORMAL LOW (ref 26.0–34.0)
MCHC: 32.5 g/dL (ref 30.0–36.0)
MCV: 76.4 fL — ABNORMAL LOW (ref 78.0–100.0)
PLATELETS: 267 10*3/uL (ref 150–400)
RBC: 5.72 MIL/uL — ABNORMAL HIGH (ref 3.87–5.11)
RDW: 15 % (ref 11.5–15.5)
WBC: 8.6 10*3/uL (ref 4.0–10.5)

## 2015-04-13 LAB — URINALYSIS, ROUTINE W REFLEX MICROSCOPIC
Bilirubin Urine: NEGATIVE
Glucose, UA: NEGATIVE mg/dL
Hgb urine dipstick: NEGATIVE
Ketones, ur: NEGATIVE mg/dL
Leukocytes, UA: NEGATIVE
Nitrite: NEGATIVE
Protein, ur: NEGATIVE mg/dL
Specific Gravity, Urine: 1.027 (ref 1.005–1.030)
pH: 6.5 (ref 5.0–8.0)

## 2015-04-13 LAB — I-STAT BETA HCG BLOOD, ED (MC, WL, AP ONLY): I-stat hCG, quantitative: <5 m[IU]/mL (ref <5)

## 2015-04-13 MED ORDER — HYDROCODONE-ACETAMINOPHEN 5-325 MG PO TABS
1.0000 | ORAL_TABLET | Freq: Once | ORAL | Status: AC
  Administered 2015-04-13: 1 via ORAL
  Filled 2015-04-13: qty 1

## 2015-04-13 MED ORDER — IBUPROFEN 800 MG PO TABS: 800.0000 mg | ORAL_TABLET | Freq: Three times a day (TID) | ORAL | Status: DC | PRN

## 2015-04-13 MED ORDER — TRAMADOL HCL 50 MG PO TABS: 50.0000 mg | ORAL_TABLET | Freq: Four times a day (QID) | ORAL | Status: DC | PRN

## 2015-04-13 NOTE — ED Notes (Signed)
Discharge instructions, follow up care, and rx x2 reviewed with patient. Patient verbalized understanding. 

## 2015-04-13 NOTE — ED Notes (Signed)
Unable to get vitals at this time. Pt is not in room.

## 2015-04-13 NOTE — Discharge Instructions (Signed)
Return here as needed.  Follow-up with your GYN doctor.  Your ultrasound did not show any abnormalities
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
## 2015-04-13 NOTE — ED Notes (Signed)
PA student at bedside.
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
## 2015-04-13 NOTE — ED Notes (Signed)
Ultrasound at bedside

## 2015-04-13 NOTE — ED Notes (Signed)
Patient transported to Ultrasound 

## 2015-05-27 DIAGNOSIS — G43109 Migraine with aura, not intractable, without status migrainosus: Secondary | ICD-10-CM | POA: Insufficient documentation

## 2015-07-03 ENCOUNTER — Emergency Department (HOSPITAL_COMMUNITY)
Admission: EM | Admit: 2015-07-03 | Discharge: 2015-07-03 | Disposition: A | Discharge status: Home / Self Care | Payer: Medicaid Other | Attending: Emergency Medicine | Admitting: Emergency Medicine

## 2015-07-03 ENCOUNTER — Encounter (HOSPITAL_COMMUNITY): Payer: Self-pay | Admitting: Emergency Medicine

## 2015-07-03 DIAGNOSIS — Z880 Allergy status to penicillin: Secondary | ICD-10-CM | POA: Insufficient documentation

## 2015-07-03 DIAGNOSIS — R110 Nausea: Secondary | ICD-10-CM | POA: Insufficient documentation

## 2015-07-03 DIAGNOSIS — M79651 Pain in right thigh: Secondary | ICD-10-CM | POA: Diagnosis present

## 2015-07-03 DIAGNOSIS — M79606 Pain in leg, unspecified: Secondary | ICD-10-CM

## 2015-07-03 DIAGNOSIS — G8929 Other chronic pain: Secondary | ICD-10-CM | POA: Diagnosis not present

## 2015-07-03 DIAGNOSIS — M79652 Pain in left thigh: Secondary | ICD-10-CM | POA: Insufficient documentation

## 2015-07-03 DIAGNOSIS — R200 Anesthesia of skin: Secondary | ICD-10-CM | POA: Insufficient documentation

## 2015-07-03 DIAGNOSIS — M545 Low back pain: Secondary | ICD-10-CM | POA: Diagnosis not present

## 2015-07-03 DIAGNOSIS — Z87440 Personal history of urinary (tract) infections: Secondary | ICD-10-CM | POA: Diagnosis not present

## 2015-07-03 DIAGNOSIS — Z8679 Personal history of other diseases of the circulatory system: Secondary | ICD-10-CM | POA: Insufficient documentation

## 2015-07-03 DIAGNOSIS — Z8619 Personal history of other infectious and parasitic diseases: Secondary | ICD-10-CM | POA: Diagnosis not present

## 2015-07-03 DIAGNOSIS — Z87891 Personal history of nicotine dependence: Secondary | ICD-10-CM | POA: Insufficient documentation

## 2015-07-03 DIAGNOSIS — Z79818 Long term (current) use of other agents affecting estrogen receptors and estrogen levels: Secondary | ICD-10-CM | POA: Insufficient documentation

## 2015-07-03 LAB — BASIC METABOLIC PANEL
Anion gap: 10 (ref 5–15)
BUN: 12 mg/dL (ref 6–20)
CALCIUM: 8.6 mg/dL — AB (ref 8.9–10.3)
CO2: 25 mmol/L (ref 22–32)
CREATININE: 0.7 mg/dL (ref 0.44–1.00)
Chloride: 103 mmol/L (ref 101–111)
GFR calc Af Amer: >60 mL/min (ref >60)
GFR calc non Af Amer: >60 mL/min (ref >60)
Glucose, Bld: 97 mg/dL (ref 65–99)
Potassium: 3.9 mmol/L (ref 3.5–5.1)
Sodium: 138 mmol/L (ref 135–145)

## 2015-07-03 LAB — CK: Total CK: 74 U/L (ref 38–234)

## 2015-07-03 MED ORDER — HYDROCODONE-ACETAMINOPHEN 5-325 MG PO TABS
2.0000 | ORAL_TABLET | Freq: Once | ORAL | Status: AC
  Administered 2015-07-03: 2 via ORAL
  Filled 2015-07-03: qty 2

## 2015-07-03 MED ORDER — NAPROXEN 500 MG PO TABS: 500.0000 mg | ORAL_TABLET | Freq: Two times a day (BID) | ORAL | Status: DC

## 2015-07-03 MED ORDER — METHOCARBAMOL 500 MG PO TABS: 1000.0000 mg | ORAL_TABLET | Freq: Four times a day (QID) | ORAL | Status: DC

## 2015-07-03 NOTE — ED Notes (Signed)
Per pt, states B/L leg pain that started yesterday-states happened before when she was pregnant

## 2015-07-03 NOTE — ED Provider Notes (Signed)
CSN: [id number]     Arrival date & time 07/03/15  1656 History  By signing my name below, I, Emmanuella Mensah, attest that this documentation has been prepared under the direction and in the presence of Renne Crigler, PA-C. Electronically Signed: Angelene Giovanni, ED Scribe. 07/03/2015. 6:07 PM.      Chief Complaint  Patient presents with  . Leg Pain   The history is provided by the patient. No language interpreter was used.   HPI Comments: Kelsey Lynch is a 30 y.o. female with a hx of chronic lower back pain who presents to the Emergency Department complaining of gradually worsening moderate bilateral upper leg pain that radiates towards her bilateral feet onset one week ago. She reports associated numbness, tingling, nausea, and pain with ambulation. She states that her pain is worse when she stands or sits for a long period of time and is unable to sleep. She states that she had these symptoms in the past when she was pregnant. She denies any recent falls, injuries, or trauma. Pt is currently in physical therapy for her chronic back pain for approx. 6 weeks before she can get an MRI. She is also taking Hydrocodone for the pain. [place] Substance Database shows 12 rx for narcotics in the past 6 months, most recently oxycodone  tablets #40 6 days ago. States she has been out of pain medication for several days. She denies a hx of PE or DVT. She also denies any recent travel. No fever, chills, vomiting, color changes, urinary symptoms, or bowel/bladder incontinence.    Past Medical History  Diagnosis Date  . Back pain, chronic   . STD (sexually transmitted disease)     herpes  . Infection     UTI  . Vaginal Pap smear, abnormal     bx and trx  . 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 Headache(784.0)     Migraines   Past Surgical History  Procedure Laterality Date  . No past surgeries     Family History  Problem Relation Age of Onset  . Diabetes Paternal Grandmother     Social History  Substance Use Topics  . Smoking status: Former Games developer  . Smokeless tobacco: Never Used     Comment: quit before preg  . Alcohol Use: No     Comment: Has stopped for multiple months   OB History    Gravida Para Term Preterm AB TAB SAB Ectopic Multiple Living   0 1     Review of Systems  Constitutional: Negative for fever and unexpected weight change.  Gastrointestinal: Positive for nausea. Negative for vomiting and constipation.       Negative for fecal incontinence.   Genitourinary: Negative for dysuria, frequency, hematuria, flank pain, vaginal bleeding, vaginal discharge and pelvic pain.       Negative for urinary incontinence or retention.  Musculoskeletal: Positive for back pain (lower) and arthralgias (bilateral leg pain).  Skin: Negative for color change.  Neurological: Positive for numbness. Negative for weakness.       Denies saddle paresthesias.      Allergies  Shellfish allergy and Penicillins  Home Medications   Prior to Admission medications   Medication Sig Start Date End Date Taking? Authorizing Provider  aspirin-acetaminophen-caffeine (EXCEDRIN MIGRAINE) [phone] MG per tablet Take 1 tablet by mouth every 6 (six) hours as needed for headache (headache).    Historical Provider, MD  clindamycin (CLEOCIN) 150  MG capsule Take 2 capsules (300 mg total) by mouth 4 (four) times daily. Patient not taking: Reported on 10/23/2014 10/19/14   Gilda Crease, MD  ibuprofen (ADVIL,MOTRIN) 800 MG tablet Take 1 tablet (800 mg total) by mouth every 8 (eight) hours as needed. 04/13/15   Charlestine Night, PA-C  Norethin Ace-Eth Estrad-FE (MICROGESTIN FE 1/20 PO) Take 1 tablet by mouth daily.    Historical Provider, MD  traMADol (ULTRAM) 50 MG tablet Take 1 tablet (50 mg total) by mouth every 6 (six) hours as needed. 04/13/15   Christopher Lawyer, PA-C   BP 104/84 mmHg  Pulse 66  Temp(Src) 97.8 F (36.6 C) (Oral)  Resp 18  SpO2 100%   LMP 06/23/2015 Physical Exam  Constitutional: She is oriented to person, place, and time. She appears well-developed and well-nourished. No distress.  HENT:  Head: Normocephalic and atraumatic.  Eyes: Conjunctivae and EOM are normal. Right eye exhibits no discharge. Left eye exhibits no discharge.  Neck: Normal range of motion. Neck supple. No tracheal deviation present.  Cardiovascular: Normal rate, regular rhythm and normal heart sounds.   Pulmonary/Chest: Effort normal and breath sounds normal. No respiratory distress.  Abdominal: Soft. There is no tenderness. There is no CVA tenderness.  Musculoskeletal: Normal range of motion. She exhibits tenderness.  No step-off noted with palpation of spine. Bilateral thigh tenderness. No swelling, warmth or redness.   Neurological: She is alert and oriented to person, place, and time. She has normal strength and normal reflexes. No sensory deficit.  5/5 strength in entire lower extremities bilaterally. No sensation deficit.   Skin: Skin is warm and dry. No rash noted.  Psychiatric: She has a normal mood and affect. Her behavior is normal.  Nursing note and vitals reviewed.   ED Course  Procedures (including critical care time) DIAGNOSTIC STUDIES: Oxygen Saturation is 100% on RA, normal by my interpretation.    COORDINATION OF CARE: 6:00 PM- Pt advised of plan for treatment and pt agrees. Pt will receive lab work for further evaluation.    Labs Review Labs Reviewed  BASIC METABOLIC PANEL - Abnormal; Notable for the following:    Calcium 8.6 (*)    All other components within normal limits  CK   Renne Crigler, PA-C has personally reviewed and evaluated these lab results as part of his medical decision-making.  7:54 PM CK and BMP are negative. Patient updated. We will treat with Robaxin and naproxen. Encouraged continued follow-up and evaluation through her orthopedist.   Patient prescribed muscle relaxer and counseled on proper use of  muscle relaxant medication.    Patient urged to follow-up with PCP if pain does not improve with treatment and rest or if pain becomes recurrent. Urged to return with worsening severe pain, loss of bowel or bladder control, trouble walking.   The patient verbalizes understanding and agrees with the plan.   MDM   Final diagnoses:  Pain of lower extremity, unspecified laterality    Patient with back pain, unclear etiology of bilateral leg symptoms. Does not fit with typical lumbar radiculopathy. No objective neurological deficits on exam. Patient is ambulatory. CK and lites are normal. No warning symptoms of back pain including: fecal incontinence, urinary retention or overflow incontinence, night sweats, waking from sleep with back pain, unexplained fevers or weight loss, h/o cancer, IVDU, recent trauma. No concern for cauda equina, epidural abscess, or other serious cause of back pain. Conservative measures such as rest, ice/heat and pain medicine indicated with PCP follow-up if  no improvement with conservative management. No emergent imaging indicated at this time. Will avoid narcotics given recent prescriptions.  I personally performed the services described in this documentation, which was scribed in my presence. The recorded information has been reviewed and is accurate.        Renne CriglerJoshua Byrd Terrero, PA-C 07/03/15 2000  Lyndal Pulleyaniel Knott, MD 07/03/15 [phone]

## 2015-07-03 NOTE — Discharge Instructions (Signed)
Please read and follow all provided instructions.  Your diagnoses today include:  1. Pain of lower extremity, unspecified laterality     Tests performed today include:  Vital signs - see below for your results today  CK - test for muscle break down is negative  Electrolytes - normal  Medications prescribed:   Robaxin (methocarbamol) - muscle relaxer medication  DO NOT drive or perform any activities that require you to be awake and alert because this medicine can make you drowsy.    Naproxen - anti-inflammatory pain medication  Do not exceed 500mg  naproxen every 12 hours, take with food  You have been prescribed an anti-inflammatory medication or NSAID. Take with food. Take smallest effective dose for the shortest duration needed for your pain. Stop taking if you experience stomach pain or vomiting.   Take any prescribed medications only as directed.  Home care instructions:   Follow any educational materials contained in this packet  Please rest, use ice or heat on your back for the next several days  Do not lift, push, pull anything more than 10 pounds for the next week  Follow-up instructions: Please follow-up with your primary care provider in the next 1 week for further evaluation of your symptoms.   Return instructions:  SEEK IMMEDIATE MEDICAL ATTENTION IF YOU HAVE:  New numbness, tingling, weakness, or problem with the use of your arms or legs  Severe back pain not relieved with medications  Loss control of your bowels or bladder  Increasing pain in any areas of the body (such as chest or abdominal pain)  Shortness of breath, dizziness, or fainting.   Worsening nausea (feeling sick to your stomach), vomiting, fever, or sweats  Any other emergent concerns regarding your health   Additional Information:  Your vital signs today were: BP 104/84 mmHg   Pulse 66   Temp(Src) 97.8 F (36.6 C) (Oral)   Resp 18   SpO2 100%   LMP 06/23/2015 If your blood  pressure (BP) was elevated above 135/85 this visit, please have this repeated by your doctor within one month. --------------

## 2015-07-19 ENCOUNTER — Encounter: Payer: Self-pay | Admitting: Neurology

## 2015-07-19 ENCOUNTER — Ambulatory Visit (INDEPENDENT_AMBULATORY_CARE_PROVIDER_SITE_OTHER): Payer: Medicaid Other | Admitting: Neurology

## 2015-07-19 VITALS — BP 113/85 | HR 81 | Ht 62.0 in | Wt 119.0 lb

## 2015-07-19 DIAGNOSIS — R269 Unspecified abnormalities of gait and mobility: Secondary | ICD-10-CM | POA: Diagnosis not present

## 2015-07-19 DIAGNOSIS — M545 Low back pain, unspecified: Secondary | ICD-10-CM | POA: Insufficient documentation

## 2015-07-19 DIAGNOSIS — M542 Cervicalgia: Secondary | ICD-10-CM | POA: Diagnosis not present

## 2015-07-19 MED ORDER — DULOXETINE HCL 60 MG PO CPEP: 60.0000 mg | ORAL_CAPSULE | Freq: Every day | ORAL | Status: DC

## 2015-07-19 NOTE — Progress Notes (Signed)
PATIENT: Kelsey Lynch DOB: [birth year]  Chief Complaint  Patient presents with  . Back Pain    Reports mid and low back pain that raditates, along with numbness, down her bilateral legs to her ankles.  Symptoms started ten years ago and have continued to progressively get worse.     HISTORICAL  Kelsey Lynch is a 30 years old right-handed female, seen in refer by orthopedic doctor Lunette Stands in July 19 2015 for evaluation of midline low back pain, traveling down to bilateral lower extremity for ten years  She complains of low chronic low back pain for over 10 years, getting worse since 2016, to the point of severe constant midline low back pain, radiating pain to bilateral lower extremity, difficulty sleeping, difficulty walking, could not take care of her 14-year-old child, she also complains of chronic bilateral neck pain, no radiating pain from neck to her upper extremity, she denies bowel and bladder incontinence, has constant lateral feet cold numbness tingling paresthesia. 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   She was seen by orthopedic clinic.   I have personally reviewed MRI of lumbar in February 2017 from [facility] orthopedic specialists, essentially normal, no evidence of canal or foraminal stenosis She presented to emergency room in February 2017 for her worsening low back pain, I reviewed laboratory evaluation, normal BMP, CBC, CPK   REVIEW OF SYSTEMS: Full 14 system review of systems performed and notable only for Swelling in legs, joint pain, achy muscles ALLERGIES: Allergies  Allergen Reactions  . Shellfish Allergy Anaphylaxis and Swelling  . Penicillins Hives    Has patient had a PCN reaction causing immediate rash, facial/tongue/throat swelling, SOB or lightheadedness with hypotension: Yes  Has patient had a PCN reaction causing severe rash involving mucus membranes or skin necrosis: Yes Has patient had a PCN reaction that  required hospitalization No Has patient had a PCN reaction occurring within the last 10 years: Yes  If all of the above answers are "NO", then may proceed with Cephalosporin use.     HOME MEDICATIONS: Current Outpatient Prescriptions  Medication Sig Dispense Refill  . methocarbamol (ROBAXIN) 500 MG tablet Take 2 tablets (1,000 mg total) by mouth 4 (four) times daily. 30 tablet 0  . naproxen (NAPROSYN) 500 MG tablet Take 1 tablet (500 mg total) by mouth 2 (two) times daily. 30 tablet 0  . Norethin Ace-Eth Estrad-FE (MICROGESTIN FE 1/20 PO) Take 1 tablet by mouth daily.    . Oxycodone HCl 10 MG TABS Take by mouth every 6 (six) hours as needed.     No current facility-administered medications for this visit.    PAST MEDICAL HISTORY: Past Medical History  Diagnosis Date  . Back pain, chronic   . STD (sexually transmitted disease)     herpes  . Infection     UTI  . Vaginal Pap smear, abnormal     bx and trx  . 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 Headache(784.0)     Migraines    PAST SURGICAL HISTORY: Past Surgical History  Procedure Laterality Date  . No past surgeries      FAMILY HISTORY: Family History  Problem Relation Age of Onset  . Diabetes Paternal Grandmother   . Hypertension Paternal Grandmother     SOCIAL HISTORY:  Social History   Social History  . Marital Status: Single    Spouse Name: N/A  . Number of Children: 1  .  Years of Education: HS   Occupational History  . Retail sales associate    Social History Main Topics  . Smoking status: Current Some Day Smoker  . Smokeless tobacco: Never Used     Comment: Smokes 1-2 times per month.  . Alcohol Use: 0.0 oz/week    0 Standard drinks or equivalent per week     Comment: Socially  . Drug Use: No  . Sexual Activity: Yes    Birth Control/ Protection: Pill   Other Topics Concern  . Not on file   Social History Narrative   Lives at home with her son and her sister.   Right-handed.     Drinks caffeine 1-2 times weekly.     PHYSICAL EXAM   Filed Vitals:   07/19/15 1449  BP: 113/85  Pulse: 81  Height: 5\' 2"  (1.575 m)  Weight: 119 lb (53.978 kg)    Not recorded      Body mass index is 21.76 kg/(m^2).  PHYSICAL EXAMNIATION:  Gen: NAD, conversant, well nourised, obese, well groomed                     Cardiovascular: Regular rate rhythm, no peripheral edema, warm, nontender. Eyes: Conjunctivae clear without exudates or hemorrhage Neck: Supple, no carotid bruise. Pulmonary: Clear to auscultation bilaterally  musculoskeletal: Tightness of bilateral lumbar paraspinal muscles  NEUROLOGICAL EXAM:  MENTAL STATUS: Speech:    Speech is normal; fluent and spontaneous with normal comprehension.  Cognition:     Orientation to time, place and person     Normal recent and remote memory     Normal Attention span and concentration     Normal Language, naming, repeating,spontaneous speech     Fund of knowledge   CRANIAL NERVES: CN II: Visual fields are full to confrontation. Fundoscopic exam is normal with sharp discs and no vascular changes. Pupils are round equal and briskly reactive to light. CN III, IV, VI: extraocular movement are normal. No ptosis. CN V: Facial sensation is intact to pinprick in all 3 divisions bilaterally. Corneal responses are intact.  CN VII: Face is symmetric with normal eye closure and smile. CN VIII: Hearing is normal to rubbing fingers CN IX, X: Palate elevates symmetrically. Phonation is normal. CN XI: Head turning and shoulder shrug are intact CN XII: Tongue is midline with normal movements and no atrophy.  MOTOR: There is no pronator drift of out-stretched arms. Muscle bulk and tone are normal. Muscle strength is normal.  REFLEXES: Reflexes are 2+ and symmetric at the biceps, triceps, knees, and ankles. Plantar responses are flexor.  SENSORY: Intact to light touch, pinprick, position sense, and vibration sense are intact in  fingers and toes.  COORDINATION: Rapid alternating movements and fine finger movements are intact. There is no dysmetria on finger-to-nose and heel-knee-shin.    GAIT/STANCE: Posture is normal. Gait is steady with normal steps, base, arm swing, and turning. Heel and toe walking are normal. Tandem gait is normal.  Romberg is absent.   DIAGNOSTIC DATA (LABS, IMAGING, TESTING) - I reviewed patient records, labs, notes, testing and imaging myself where available.   ASSESSMENT AND PLAN  Kelsey Lynch is a 30 y.o. female   Chronic low back pain, neck pain, Normal neurological examinations, Need to rule out spine pathology, proceed with MRI of cervical, and thoracic spine Laboratory evaluations, including TSH EMG nerve conduction study   Levert FeinsteinYijun Tayler Lassen, M.D. Ph.D.  [facility] [address], [address] [place], [place] [address]  Ph: [phone] Fax: [phone]  CC:Lunette Stands, MD

## 2015-07-20 ENCOUNTER — Encounter: Payer: Self-pay | Admitting: Neurology

## 2015-07-20 ENCOUNTER — Telehealth: Payer: Self-pay | Admitting: Neurology

## 2015-07-20 ENCOUNTER — Encounter: Payer: Self-pay | Admitting: *Deleted

## 2015-07-20 LAB — C-REACTIVE PROTEIN: CRP: 1.1 mg/L (ref 0.0–4.9)

## 2015-07-20 LAB — VITAMIN B12: VITAMIN B 12: 406 pg/mL (ref 211–946)

## 2015-07-20 LAB — TSH: TSH: 0.765 u[IU]/mL (ref 0.450–4.500)

## 2015-07-20 LAB — VITAMIN D 25 HYDROXY (VIT D DEFICIENCY, FRACTURES): Vit D, 25-Hydroxy: 14.1 ng/mL — ABNORMAL LOW (ref 30.0–100.0)

## 2015-07-20 LAB — SEDIMENTATION RATE: SED RATE: 2 mm/h (ref 0–32)

## 2015-07-20 LAB — ANA W/REFLEX IF POSITIVE: ANA: NEGATIVE

## 2015-07-20 MED ORDER — MELOXICAM 7.5 MG PO TABS: 7.5000 mg | ORAL_TABLET | Freq: Two times a day (BID) | ORAL | Status: DC

## 2015-07-20 NOTE — Telephone Encounter (Signed)
Please call and check on this patient.

## 2015-07-20 NOTE — Telephone Encounter (Signed)
Called patient back - she is not agreeable to trying duloxetine at a lower dose - she is requesting an alternate medication.

## 2015-07-20 NOTE — Telephone Encounter (Signed)
She declined the offer for meloxicam - stated it broke her out in a rash in the past - this was not reported to us when we reviewed her allergies in the office (added to chart).  Per Dr. Terrace ArabiaYan, she needs to continue her methocarbamol and naproxen until her EMG appt on 07/27/15.  She will further discuss medications at her that appt.  Patient agreeable to this plan.

## 2015-07-20 NOTE — Addendum Note (Signed)
Addended by: Levert FeinsteinYAN, Oluwadamilola Rosamond on: 07/20/2015 03:18 PM   Modules accepted: Orders

## 2015-07-20 NOTE — Telephone Encounter (Signed)
Attempted to call patient - no answer - mailbox full - will try to call again this morning.

## 2015-07-20 NOTE — Telephone Encounter (Signed)
I have called in mobic 7.5mg  bid, send info through my chart already

## 2015-07-20 NOTE — Telephone Encounter (Signed)
Spoke to patient - states she took one tablet of duloxetine 60mg  at bedtime and started vomiting shortly after - states she only saw a scant amount of blood after she had already vomited multiple times - no further problems with that. Per Dr. Terrace ArabiaYan, will decrease dose of Cymbalta to 20mg  and will titrate up to 60mg , as tolerated.

## 2015-07-21 ENCOUNTER — Encounter: Payer: Self-pay | Admitting: Neurology

## 2015-07-21 ENCOUNTER — Encounter: Payer: Self-pay | Admitting: *Deleted

## 2015-07-23 ENCOUNTER — Encounter: Payer: Self-pay | Admitting: Neurology

## 2015-07-26 NOTE — Telephone Encounter (Signed)
Patient has an appt for NCV/EMG on 07/27/15 and MRI scans on 07/29/15.  Per Dr. Terrace ArabiaYan, she will discuss medication alternatives with patient at her appt tomorrow.  Patent is feeling some better today.  Patient agreeable to this plan.

## 2015-07-27 ENCOUNTER — Ambulatory Visit (INDEPENDENT_AMBULATORY_CARE_PROVIDER_SITE_OTHER): Payer: Self-pay | Admitting: Neurology

## 2015-07-27 ENCOUNTER — Ambulatory Visit (INDEPENDENT_AMBULATORY_CARE_PROVIDER_SITE_OTHER): Payer: Medicaid Other | Admitting: Neurology

## 2015-07-27 DIAGNOSIS — Z0289 Encounter for other administrative examinations: Secondary | ICD-10-CM

## 2015-07-27 DIAGNOSIS — M545 Low back pain: Secondary | ICD-10-CM | POA: Diagnosis not present

## 2015-07-27 DIAGNOSIS — R269 Unspecified abnormalities of gait and mobility: Secondary | ICD-10-CM

## 2015-07-27 NOTE — Procedures (Signed)
   NCS (NERVE CONDUCTION STUDY) WITH EMG (ELECTROMYOGRAPHY) REPORT   STUDY DATE: March 28th 2017 PATIENT NAME: Jenene SlickerMarendy Musolino DOB: 05/19/1985 MRN: [id number]    TECHNOLOGIST: Gearldine ShownLorraine Jones ELECTROMYOGRAPHER: Levert FeinsteinYan, Leonore Frankson M.D.  CLINICAL INFORMATION: 30 years old female presenting with chronic low back pain  FINDINGS: NERVE CONDUCTION STUDY: Bilateral peroneal sensory responses were normal. Bilateral peroneal to EDB and tibial motor responses were normal. Bilateral tibial H reflexes were present and symmetric.  Left median, ulnar sensory and motor responses were normal.  NEEDLE ELECTROMYOGRAPHY: Selected needle examinations were performed at bilateral lower extremity muscles and bilateral lumbar sacral paraspinal muscles.  Bilateral tibialis anterior, tibialis posterior, medial gastrocnemius, vastus lateralis, left peroneal longus was normal.   There was no spontaneous activity at bilateral lumbar sacral paraspinal muscles,  at bilateral L4-5 S1.   IMPRESSION:  This is a normal study.  There is no electrodiagnostic evidence of large fiber peripheral neuropathy or bilateral lumbosacral radiculopathy.   INTERPRETING PHYSICIAN:   Levert FeinsteinYan, Toshia Larkin M.D. Ph.D. [facility] [address], [address] [place], [place] [address] [phone]

## 2015-07-27 NOTE — Progress Notes (Signed)
EMG nerve conduction study was normal, there is no evidence of large fiber peripheral neuropathy or bilateral lumbosacral radiculopathy.

## 2015-07-29 ENCOUNTER — Encounter: Payer: Self-pay | Admitting: Neurology

## 2015-07-29 ENCOUNTER — Ambulatory Visit
Admission: RE | Admit: 2015-07-29 | Discharge: 2015-07-29 | Disposition: A | Discharge status: Home / Self Care | Payer: Medicaid Other | Source: Ambulatory Visit | Attending: Neurology | Admitting: Neurology

## 2015-07-29 DIAGNOSIS — M545 Low back pain: Secondary | ICD-10-CM

## 2015-07-29 DIAGNOSIS — R269 Unspecified abnormalities of gait and mobility: Secondary | ICD-10-CM

## 2015-07-30 ENCOUNTER — Encounter: Payer: Self-pay | Admitting: Neurology

## 2015-08-05 ENCOUNTER — Ambulatory Visit (INDEPENDENT_AMBULATORY_CARE_PROVIDER_SITE_OTHER): Payer: Medicaid Other | Admitting: Neurology

## 2015-08-05 ENCOUNTER — Encounter: Payer: Self-pay | Admitting: Neurology

## 2015-08-05 VITALS — BP 98/62 | HR 62 | Ht 62.0 in | Wt 119.0 lb

## 2015-08-05 DIAGNOSIS — M545 Low back pain: Secondary | ICD-10-CM

## 2015-08-05 NOTE — Progress Notes (Signed)
Chief Complaint  Patient presents with  . Back Pain    She is here to further review test results and discuss the suggested pain mangangement referral.      PATIENT: Kelsey Lynch DOB: October 26, 1985  Chief Complaint  Patient presents with  . Back Pain    She is here to further review test results and discuss the suggested pain mangangement referral.     HISTORICAL  Kelsey Lynch is a 30 years old right-handed female, seen in refer by orthopedic doctor Lunette Stands in July 19 2015 for evaluation of midline low back pain, traveling down to bilateral lower extremity for ten years  She complains of low chronic low back pain for over 10 years, getting worse since 2016, to the point of severe constant midline low back pain, radiating pain to bilateral lower extremity, difficulty sleeping, difficulty walking, could not take care of her 40-year-old child, she also complains of chronic bilateral neck pain, no radiating pain from neck to her upper extremity, she denies bowel and bladder incontinence, has constant lateral feet cold numbness tingling paresthesia. 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   She was seen by orthopedic clinic.   I have personally reviewed MRI of lumbar in February 2017 from [facility] orthopedic specialists, essentially normal, no evidence of canal or foraminal stenosis She presented to emergency room in February 2017 for her worsening low back pain, I reviewed laboratory evaluation, normal BMP, CBC, CPK  UPDATE April 6th 2017: She continue complains significant low back pain, limitation from her pain, denies bowel and bladder incontinence,  EMG nerve conduction study was normal, there was no evidence of neuropathy, lumbosacral radiculopathy.  We have personally reviewed MRI of the cervical, thoracic, lumbar spine, no evidence of foraminal or canal stenosis,   REVIEW OF SYSTEMS: Full 14 system review of systems performed and  notable only for Swelling in legs, joint pain, achy muscles ALLERGIES: Allergies  Allergen Reactions  . Shellfish Allergy Anaphylaxis and Swelling  . Duloxetine Nausea And Vomiting  . Penicillins Hives    Has patient had a PCN reaction causing immediate rash, facial/tongue/throat swelling, SOB or lightheadedness with hypotension: Yes  Has patient had a PCN reaction causing severe rash involving mucus membranes or skin necrosis: Yes Has patient had a PCN reaction that required hospitalization No Has patient had a PCN reaction occurring within the last 10 years: Yes  If all of the above answers are "NO", then may proceed with Cephalosporin use.   . Meloxicam Rash    HOME MEDICATIONS: Current Outpatient Prescriptions  Medication Sig Dispense Refill  . methocarbamol (ROBAXIN) 500 MG tablet Take 2 tablets (1,000 mg total) by mouth 4 (four) times daily. 30 tablet 0  . Norethin Ace-Eth Estrad-FE (MICROGESTIN FE 1/20 PO) Take 1 tablet by mouth daily.    . ondansetron (ZOFRAN) 8 MG tablet Take by mouth.    . topiramate (TOPAMAX) 25 MG tablet      No current facility-administered medications for this visit.    PAST MEDICAL HISTORY: Past Medical History  Diagnosis Date  . Back pain, chronic   . STD (sexually transmitted disease)     herpes  . Infection     UTI  . Vaginal Pap smear, abnormal     bx and trx  . 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 Headache(784.0)     Migraines    PAST SURGICAL HISTORY: Past Surgical History  Procedure Laterality  Date  . No past surgeries      FAMILY HISTORY: Family History  Problem Relation Age of Onset  . Diabetes Paternal Grandmother   . Hypertension Paternal Grandmother     SOCIAL HISTORY:  Social History   Social History  . Marital Status: Single    Spouse Name: N/A  . Number of Children: 1  . Years of Education: HS   Occupational History  . Retail sales associate    Social History Main Topics  . Smoking  status: Current Some Day Smoker  . Smokeless tobacco: Never Used     Comment: Smokes 1-2 times per month.  . Alcohol Use: 0.0 oz/week    0 Standard drinks or equivalent per week     Comment: Socially  . Drug Use: No  . Sexual Activity: Yes    Birth Control/ Protection: Pill   Other Topics Concern  . Not on file   Social History Narrative   Lives at home with her son and her sister.   Right-handed.   Drinks caffeine 1-2 times weekly.     PHYSICAL EXAM   Filed Vitals:   08/05/15 1306  BP: 98/62  Pulse: 62  Height: 5\' 2"  (1.575 m)  Weight: 119 lb (53.978 kg)    Not recorded      Body mass index is 21.76 kg/(m^2).  PHYSICAL EXAMNIATION:  Gen: NAD, conversant, well nourised, obese, well groomed                     Cardiovascular: Regular rate rhythm, no peripheral edema, warm, nontender. Eyes: Conjunctivae clear without exudates or hemorrhage Neck: Supple, no carotid bruise. Pulmonary: Clear to auscultation bilaterally  musculoskeletal: Tightness of bilateral lumbar paraspinal muscles  NEUROLOGICAL EXAM:  MENTAL STATUS: Speech:    Speech is normal; fluent and spontaneous with normal comprehension.  Cognition:     Orientation to time, place and person     Normal recent and remote memory     Normal Attention span and concentration     Normal Language, naming, repeating,spontaneous speech     Fund of knowledge   CRANIAL NERVES: CN II: Visual fields are full to confrontation. Fundoscopic exam is normal with sharp discs and no vascular changes. Pupils are round equal and briskly reactive to light. CN III, IV, VI: extraocular movement are normal. No ptosis. CN V: Facial sensation is intact to pinprick in all 3 divisions bilaterally. Corneal responses are intact.  CN VII: Face is symmetric with normal eye closure and smile. CN VIII: Hearing is normal to rubbing fingers CN IX, X: Palate elevates symmetrically. Phonation is normal. CN XI: Head turning and shoulder  shrug are intact CN XII: Tongue is midline with normal movements and no atrophy.  MOTOR: There is no pronator drift of out-stretched arms. Muscle bulk and tone are normal. Muscle strength is normal.  REFLEXES: Reflexes are 2+ and symmetric at the biceps, triceps, knees, and ankles. Plantar responses are flexor.  SENSORY: Intact to light touch, pinprick, position sense, and vibration sense are intact in fingers and toes.  COORDINATION: Rapid alternating movements and fine finger movements are intact. There is no dysmetria on finger-to-nose and heel-knee-shin.    GAIT/STANCE: Posture is normal. Gait is steady with normal steps, base, arm swing, and turning. Heel and toe walking are normal. Tandem gait is normal.  Romberg is absent.   DIAGNOSTIC DATA (LABS, IMAGING, TESTING) - I reviewed patient records, labs, notes, testing and imaging myself where available.  ASSESSMENT AND PLAN  Estie Sproule is a 30 y.o. female   Chronic low back pain, neck pain, Normal neurological examinations, No neurological etiology found, extensive imaging study showed no significant abnormality detailed above, Her pain is most consistent with musculoskeletal etiology I have suggested her continue moderate stretching exercise   Levert Feinstein, M.D. Ph.D.  [facility] [address] [place], [place] [address] Ph: [phone] Fax: [phone]  CC:Lunette Stands, MD

## 2016-03-13 ENCOUNTER — Emergency Department (HOSPITAL_COMMUNITY)
Admission: EM | Admit: 2016-03-13 | Discharge: 2016-03-13 | Disposition: A | Discharge status: Home / Self Care | Payer: Medicaid Other | Attending: Emergency Medicine | Admitting: Emergency Medicine

## 2016-03-13 ENCOUNTER — Emergency Department (HOSPITAL_COMMUNITY)
Admission: EM | Admit: 2016-03-13 | Discharge: 2016-03-13 | Disposition: A | Discharge status: Home / Self Care | Payer: Medicaid Other | Source: Home / Self Care

## 2016-03-13 ENCOUNTER — Encounter (HOSPITAL_COMMUNITY): Payer: Self-pay | Admitting: *Deleted

## 2016-03-13 ENCOUNTER — Encounter (HOSPITAL_COMMUNITY): Payer: Self-pay | Admitting: Emergency Medicine

## 2016-03-13 DIAGNOSIS — R69 Illness, unspecified: Secondary | ICD-10-CM

## 2016-03-13 DIAGNOSIS — J069 Acute upper respiratory infection, unspecified: Secondary | ICD-10-CM | POA: Insufficient documentation

## 2016-03-13 DIAGNOSIS — H9201 Otalgia, right ear: Secondary | ICD-10-CM

## 2016-03-13 DIAGNOSIS — R51 Headache: Secondary | ICD-10-CM

## 2016-03-13 DIAGNOSIS — F17210 Nicotine dependence, cigarettes, uncomplicated: Secondary | ICD-10-CM | POA: Insufficient documentation

## 2016-03-13 DIAGNOSIS — B9789 Other viral agents as the cause of diseases classified elsewhere: Secondary | ICD-10-CM

## 2016-03-13 DIAGNOSIS — J111 Influenza due to unidentified influenza virus with other respiratory manifestations: Secondary | ICD-10-CM

## 2016-03-13 DIAGNOSIS — Z5321 Procedure and treatment not carried out due to patient leaving prior to being seen by health care provider: Secondary | ICD-10-CM

## 2016-03-13 NOTE — ED Triage Notes (Signed)
Pt complains of earache, headache, cough, generalized body aches, chills since Friday. Pt states she had diarrhea since yesterday. Pt denies abdominal pain, nausea, vomiting.

## 2016-03-13 NOTE — ED Notes (Signed)
Pt called no answer 

## 2016-03-13 NOTE — ED Provider Notes (Signed)
[facility]-EMERGENCY DEPT Provider Note   CSN: [id number] Arrival date & time: 03/13/16  1552  By signing my name below, I, Kelsey Lynch, attest that this documentation has been prepared under the direction and in the presence of Kelsey Acy Camprubi-Soms, PA-C. Electronically Signed: Placido SouLogan Lynch, ED Scribe. 03/13/16. 5:07 PM.   History   Chief Complaint Chief Complaint  Patient presents with  . URI  . Otalgia    HPI HPI Comments: Kelsey Lynch is a 30 y.o. female who presents to the Emergency Department complaining of constant, moderate, URI symptoms and productive cough x 3 days. She reports associated fatigue, malaise/body aches, chills, subjective fevers, congestion, right ear pain, clear rhinorrhea, and cough with yellow sputum production x3 days, with 2 days of watery diarrhea (4x yesterday and 3x today). She's had some intermittent HA as well. She has taken nyquil and ibuprofen and drank hot tea w/o significant relief. Pt denies any worsening factors. She denies any sick contacts or recent travel. She denies ear drainage, hearing loss, visual changes, sore throat, difficulty swallowing, CP, SOB, abdominal pain, n/v/c, bloody stools, melanotic stools, dysuria, hematuria, numbness, tingling, focal weakness, or rashs.   The history is provided by the patient and medical records. No language interpreter was used.  URI   This is a new problem. The current episode started more than 2 days ago. The problem has not changed since onset.There has been no fever (subjective warmth, but no known fever). Associated symptoms include diarrhea, congestion, ear pain, headaches (intermittent), rhinorrhea and cough. Pertinent negatives include no chest pain, no abdominal pain, no nausea, no vomiting, no dysuria, no sore throat, no rash and no wheezing. She has tried NSAIDs (and Nyquil) for the symptoms. The treatment provided no relief.  Otalgia  Associated symptoms include headaches (intermittent),  rhinorrhea, diarrhea and cough. Pertinent negatives include no ear discharge, no hearing loss, no sore throat, no abdominal pain, no vomiting and no rash.    Past Medical History:  Diagnosis Date  . Back pain, chronic   . Headache(784.0)    Migraines  . 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 Infection    UTI  . STD (sexually transmitted disease)    herpes  . Vaginal Pap smear, abnormal    bx and trx    Patient Active Problem List   Diagnosis Date Noted  . Low back pain 07/19/2015  . Abnormality of gait 07/19/2015  . Neck pain 07/19/2015  . Premature rupture of membranes (PROM), onset of labor before 24 hours, antepartum 07/17/2014  . 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 Cervical dysplasia, mild 06/03/2012  . ASCUS with positive high risk HPV 05/14/2012    Past Surgical History:  Procedure Laterality Date  . NO PAST SURGERIES      OB History    Gravida Para Term Preterm AB Living   1 1 1     1    SAB TAB Ectopic Multiple Live Births         0 1       Home Medications    Prior to Admission medications   Medication Sig Start Date End Date Taking? Authorizing Provider  methocarbamol (ROBAXIN) 500 MG tablet Take 2 tablets (1,000 mg total) by mouth 4 (four) times daily. 07/03/15   Renne CriglerJoshua Geiple, PA-C  Norethin Ace-Eth Estrad-FE (MICROGESTIN FE 1/20 PO) Take 1 tablet by mouth daily.    Historical Provider, MD  ondansetron [facility](ZOFRAN) 8 MG tablet Take by mouth. 07/28/15 07/27/16  Historical Provider, MD  topiramate (TOPAMAX) 25 MG tablet  07/28/15   Historical Provider, MD    Family History Family History  Problem Relation Age of Onset  . Diabetes Paternal Grandmother   . Hypertension Paternal Grandmother    Social History Social History  Substance Use Topics  . Smoking status: Current Some Day Smoker    Types: Cigarettes  . Smokeless tobacco: Never Used     Comment: Smokes 1-2 times per month.  . Alcohol use 0.0 oz/week     Comment: Socially    Allergies   Shellfish allergy; Duloxetine; Penicillins; and Meloxicam  Review of Systems Review of Systems  Constitutional: Positive for chills, fatigue and fever (subjective).  HENT: Positive for congestion, ear pain, rhinorrhea and sinus pressure. Negative for drooling, ear discharge, hearing loss, sore throat and trouble swallowing.   Eyes: Negative for visual disturbance.  Respiratory: Positive for cough. Negative for shortness of breath and wheezing.   Cardiovascular: Negative for chest pain.  Gastrointestinal: Positive for diarrhea. Negative for abdominal pain, anal bleeding, blood in stool, constipation, nausea and vomiting.  Genitourinary: Negative for dysuria and hematuria.  Musculoskeletal: Negative for arthralgias and myalgias.  Skin: Negative for color change and rash.  Allergic/Immunologic: Negative for immunocompromised state.  Neurological: Positive for headaches (intermittent). Negative for weakness and numbness.  Psychiatric/Behavioral: Negative for confusion.   A complete 10 system review of systems was obtained and all systems are negative except as noted in the HPI and PMH.   Physical Exam Updated Vital Signs BP 118/84 (BP Location: Left Arm)   Pulse 99   Temp 98.4 F (36.9 C) (Oral)   Resp 17   Ht 5\' 2"  (1.575 m)   Wt 106 lb (48.1 kg)   LMP 02/11/2016   SpO2 98%   BMI 19.39 kg/m   Physical Exam  Constitutional: She is oriented to person, place, and time. Vital signs are normal. She appears well-developed and well-nourished.  Non-toxic appearance. No distress.  Afebrile, nontoxic, NAD, texting on her cell phone during exam  HENT:  Head: Normocephalic and atraumatic.  Right Ear: Hearing, external ear and ear canal normal. Tympanic membrane is not erythematous and not bulging. A middle ear effusion (serous) is present.  Left Ear: Hearing, external ear and ear canal normal. Tympanic membrane is not erythematous and not bulging. A middle ear effusion  (serous) is present.  Nose: Mucosal edema and rhinorrhea present.  Mouth/Throat: Uvula is midline, oropharynx is clear and moist and mucous membranes are normal. No trismus in the jaw. No uvula swelling. Tonsils are 0 on the right. Tonsils are 0 on the left. No tonsillar exudate.  Ears with mild serous effusion but otherwise clear bilaterally, TMs without erythema or bulging. Nose with mild mucosal edema and rhinorrhea. Oropharynx clear and moist, without uvular swelling or deviation, no trismus or drooling, no tonsillar swelling or erythema, no exudates.    Eyes: Conjunctivae and EOM are normal. Right eye exhibits no discharge. Left eye exhibits no discharge.  Neck: Normal range of motion. Neck supple.  Cardiovascular: Normal rate, regular rhythm, normal heart sounds and intact distal pulses.  Exam reveals no gallop and no friction rub.   No murmur heard. Pulmonary/Chest: Effort normal and breath sounds normal. No respiratory distress. She has no decreased breath sounds. She has no wheezes. She has no rhonchi. She has no rales.  CTAB in all lung fields, no w/r/r, no hypoxia or increased WOB, speaking in full sentences, SpO2 98% on RA  Abdominal: Soft. Normal appearance and bowel sounds are normal. She exhibits no distension. There is no tenderness. There is no rigidity, no rebound, no guarding, no CVA tenderness, no tenderness at McBurney's point and negative Murphy's sign.  Soft, NTND, +BS throughout, no r/g/r, neg murphy's, neg mcburney's, no CVA TTP  Musculoskeletal: Normal range of motion.  Neurological: She is alert and oriented to person, place, and time. She has normal strength. No sensory deficit.  Skin: Skin is warm, dry and intact. No rash noted.  Psychiatric: She has a normal mood and affect.  Nursing note and vitals reviewed.  ED Treatments / Results  Labs (all labs ordered are listed, but only abnormal results are displayed) Labs Reviewed - No data to display  EKG  EKG  Interpretation None       Radiology No results found.  Procedures Procedures  DIAGNOSTIC STUDIES: Oxygen Saturation is 98% on RA, normal by my interpretation.    COORDINATION OF CARE: 5:07 PM Discussed next steps with pt. Pt verbalized understanding and is agreeable with the plan.    Medications Ordered in ED Medications - No data to display  Initial Impression / Assessment and Plan / ED Course  I have reviewed the triage vital signs and the nursing notes.  Pertinent labs & imaging results that were available during my care of the patient were reviewed by me and considered in my medical decision making (see chart for details).  Clinical Course     30 y.o. female here with URI symptoms x3 days, somewhat flu like. Pt is afebrile with a clear lung exam. Mild rhinorrhea. Serous effusion in both ears, no AOM findings. No abdominal tenderness, tolerating PO well here, despite stating she has diarrhea. Likely viral URI. Discussed that if it was flu, it's too late to give tamiflu, so OTC symptom control discussed. Doubt need for further emergent work up at this time, doubt need for CXR. Pt is agreeable to symptomatic treatment with close follow up with PCP as needed but spoke at length about emergent changing or worsening of symptoms that should prompt return to ER. Pt voices understanding and is agreeable to plan. Stable at time of discharge.   I personally performed the services described in this documentation, which was scribed in my presence. The recorded information has been reviewed and is accurate.   Final Clinical Impressions(s) / ED Diagnoses   Final diagnoses:  Viral URI with cough  Influenza-like illness  Right ear pain    New Prescriptions New Prescriptions   No medications on file     Allen DerryMercedes Camprubi-Soms, PA-C 03/13/16 1717    Pricilla LovelessScott Goldston, MD 03/16/16 1728

## 2016-03-13 NOTE — ED Notes (Signed)
Patient called twice with no response.

## 2016-03-13 NOTE — ED Triage Notes (Signed)
Patient c/o URI symptoms x weeks.  patient has cough that is productive with yellow phlegm, nasal congestion, right ear pain and fatigue.  Patient states that she was here earlier and left because she had a doctor's appt she had to get to.

## 2016-03-13 NOTE — ED Notes (Signed)
Pt called multiple times by staff w/o answer.  It is assumed she left.

## 2016-03-13 NOTE — ED Notes (Signed)
Assigned self to pt when name transferred to [id number]. Pt not present. Un assigning self.

## 2016-03-13 NOTE — Discharge Instructions (Signed)
Continue to stay well-hydrated. Gargle warm salt water and spit it out. Use chloraseptic spray as needed for sore throat. Continue to alternate between Tylenol and Ibuprofen for pain or fever. Use Mucinex for cough suppression/expectoration of mucus. Use netipot and flonase to help with nasal congestion. May consider over-the-counter Benadryl or other antihistamine to decrease secretions and for watery itchy eyes, and to help with your congestion/ear pain/symptoms. Followup with your primary care doctor in 5-7 days for recheck of ongoing symptoms. Return to emergency department for emergent changing or worsening of symptoms.

## 2016-08-02 ENCOUNTER — Ambulatory Visit: Payer: Medicaid Other | Admitting: Neurology

## 2016-08-02 ENCOUNTER — Telehealth: Payer: Self-pay | Admitting: *Deleted

## 2016-08-02 NOTE — Telephone Encounter (Signed)
No showed consult appt - she was coming in for a new problem.

## 2016-08-03 ENCOUNTER — Encounter: Payer: Self-pay | Admitting: Neurology

## 2016-08-23 ENCOUNTER — Ambulatory Visit: Payer: Medicaid Other | Admitting: Neurology

## 2016-09-07 ENCOUNTER — Telehealth: Payer: Self-pay | Admitting: *Deleted

## 2016-09-07 ENCOUNTER — Ambulatory Visit: Payer: Medicaid Other | Admitting: Neurology

## 2016-09-07 NOTE — Telephone Encounter (Signed)
No showed consult appointment. 

## 2016-09-08 ENCOUNTER — Encounter: Payer: Self-pay | Admitting: Neurology

## 2017-04-11 DIAGNOSIS — A6000 Herpesviral infection of urogenital system, unspecified: Secondary | ICD-10-CM | POA: Insufficient documentation

## 2017-04-19 IMAGING — CR DG LUMBAR SPINE COMPLETE 4+V
5 series · 5 of 5 positions shown · non-contrast
Comparison: KUB 09/13/2010.  Chest radiographs 04/07/2009.

CLINICAL DATA: 28-year-old female with increasing lumbar back pain
for 3-4 months. Numbness and pain in both eyes. No known injury.
Initial encounter.

EXAM:
LUMBAR SPINE - COMPLETE 4+ VIEW

[view not recorded (1 of 5)]
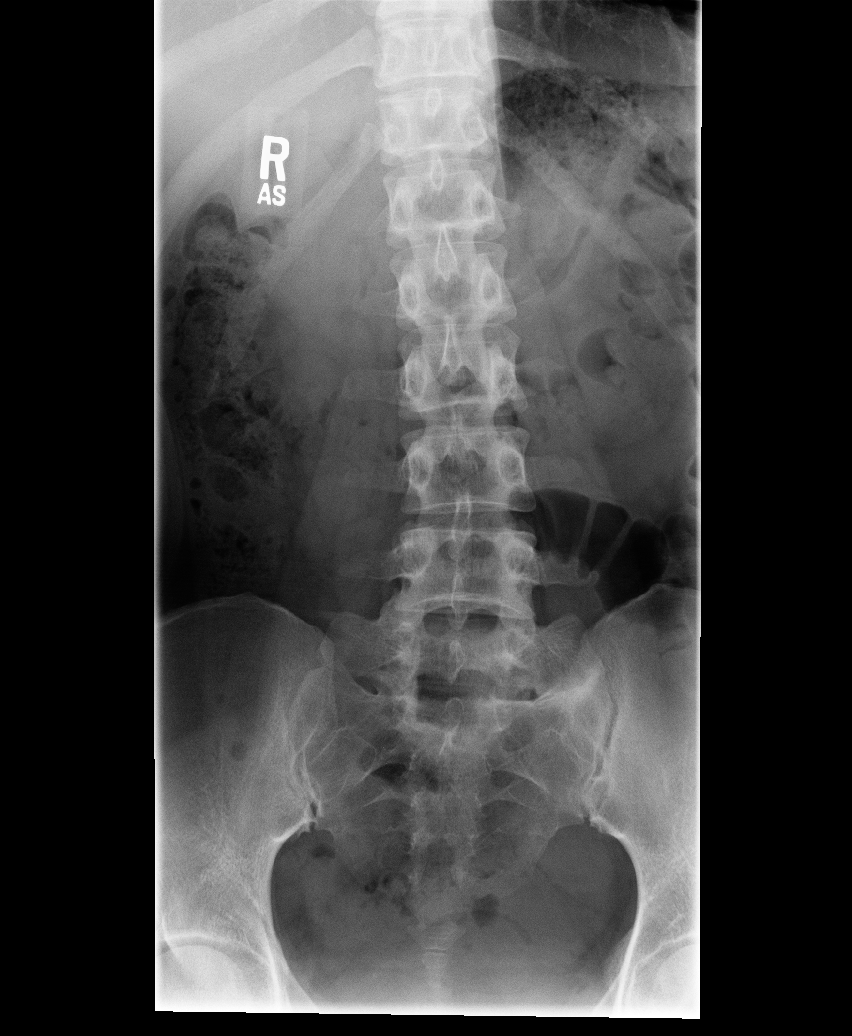

[view not recorded (2 of 5)]
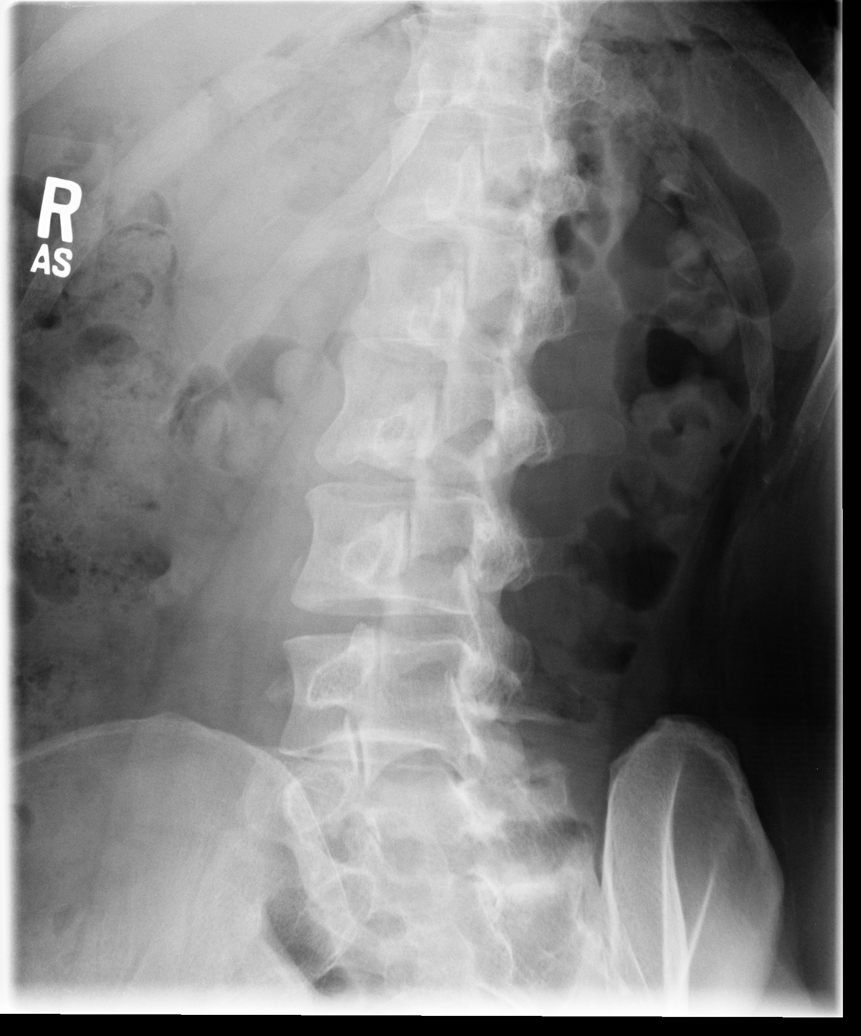

[view not recorded (3 of 5)]
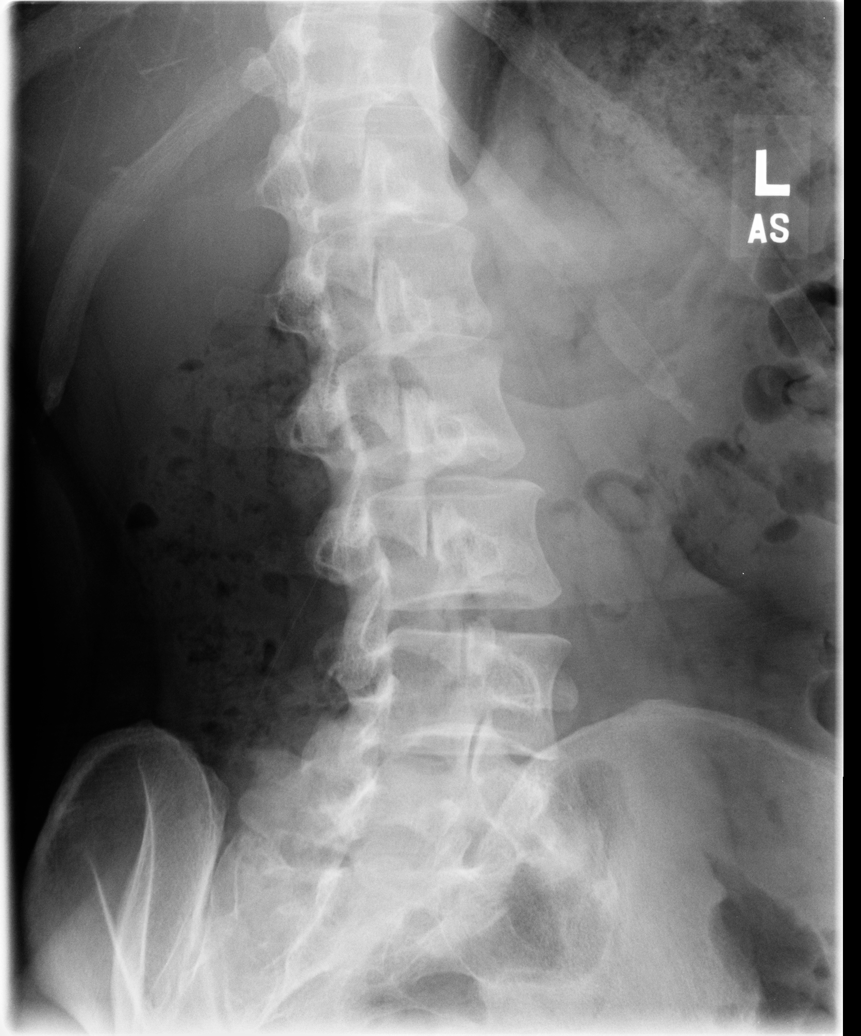

[view not recorded (4 of 5)]
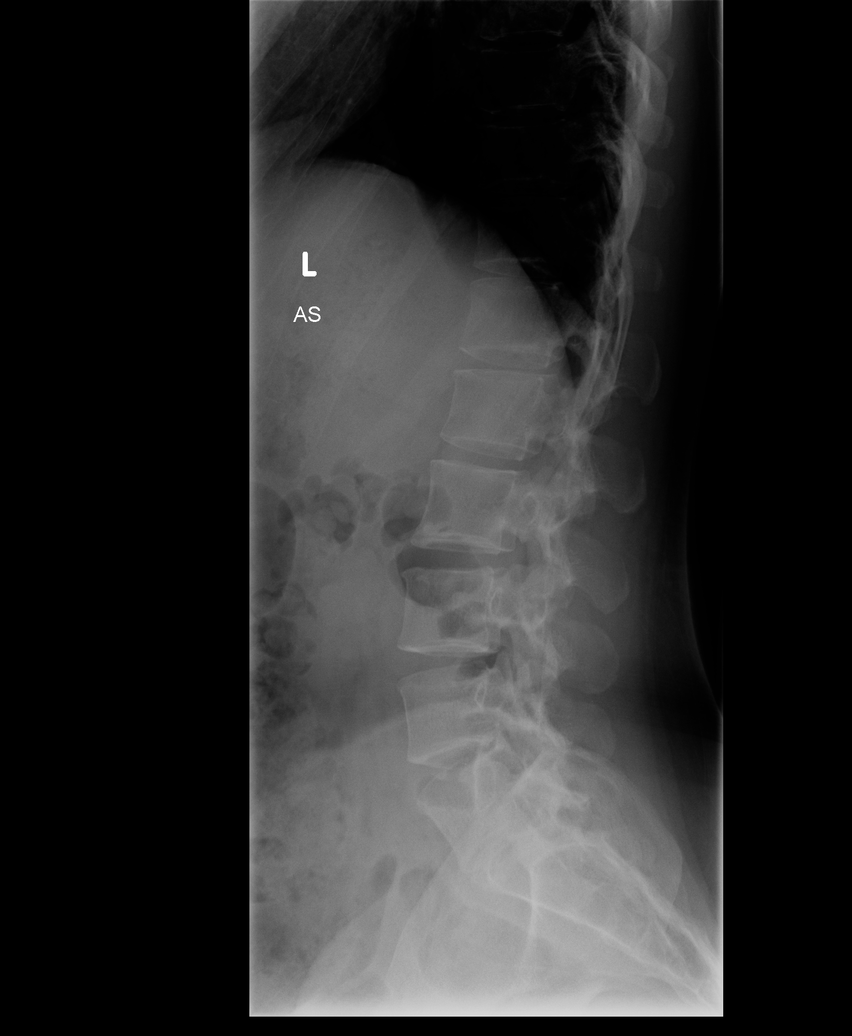

[view not recorded (5 of 5)]
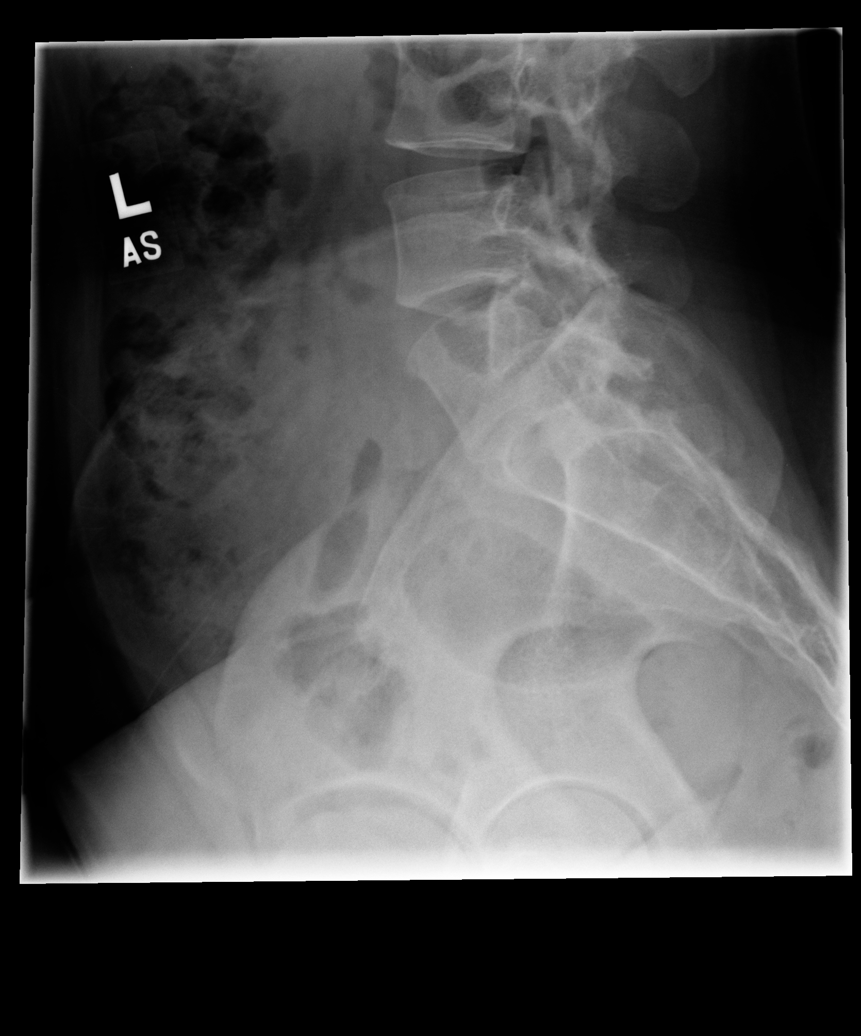

[5 of 5 positions shown; findings below may reference images not displayed]

FINDINGS: Twelve full size ribs demonstrated on the 7191 comparison. The S1
level is partially lumbarized (more so on the right). There is a
sclerotic left side S1-S2 assimilation joint (image 1). Mild
levoconvex lumbar spine curvature. On the lateral view there is
normal vertebral height and alignment. No pars fracture. Relatively
preserved disc spaces. Bone mineralization is within normal limits.
Grossly intact visible lower thoracic levels. SI joints within
normal limits.
IMPRESSION: 1.  No acute osseous abnormality identified in the lumbar spine
2. Mildly transitional anatomy. Mildly degenerated left side S1-S2
assimilation joint.

## 2017-09-25 DIAGNOSIS — F322 Major depressive disorder, single episode, severe without psychotic features: Secondary | ICD-10-CM | POA: Insufficient documentation

## 2018-02-02 DIAGNOSIS — N390 Urinary tract infection, site not specified: Secondary | ICD-10-CM | POA: Diagnosis not present

## 2018-02-02 DIAGNOSIS — R070 Pain in throat: Secondary | ICD-10-CM | POA: Insufficient documentation

## 2018-02-02 DIAGNOSIS — Z79899 Other long term (current) drug therapy: Secondary | ICD-10-CM | POA: Diagnosis not present

## 2018-02-02 DIAGNOSIS — F17210 Nicotine dependence, cigarettes, uncomplicated: Secondary | ICD-10-CM | POA: Insufficient documentation

## 2018-02-02 DIAGNOSIS — R05 Cough: Secondary | ICD-10-CM | POA: Diagnosis present

## 2018-02-03 ENCOUNTER — Encounter (HOSPITAL_COMMUNITY): Payer: Self-pay | Admitting: Emergency Medicine

## 2018-02-03 ENCOUNTER — Emergency Department (HOSPITAL_COMMUNITY)
Admission: EM | Admit: 2018-02-03 | Discharge: 2018-02-03 | Disposition: A | Discharge status: Home / Self Care | Payer: Medicaid Other | Attending: Emergency Medicine | Admitting: Emergency Medicine

## 2018-02-03 ENCOUNTER — Other Ambulatory Visit: Payer: Self-pay

## 2018-02-03 DIAGNOSIS — B9789 Other viral agents as the cause of diseases classified elsewhere: Secondary | ICD-10-CM

## 2018-02-03 DIAGNOSIS — J069 Acute upper respiratory infection, unspecified: Secondary | ICD-10-CM

## 2018-02-03 LAB — GROUP A STREP BY PCR: GROUP A STREP BY PCR: NOT DETECTED

## 2018-02-03 MED ORDER — BENZONATATE 100 MG PO CAPS
200.0000 mg | ORAL_CAPSULE | Freq: Once | ORAL | Status: AC
  Administered 2018-02-03: 200 mg via ORAL
  Filled 2018-02-03: qty 2

## 2018-02-03 MED ORDER — LIDOCAINE VISCOUS HCL 2 % MT SOLN
15.0000 mL | Freq: Once | OROMUCOSAL | Status: AC
  Administered 2018-02-03: 15 mL via OROMUCOSAL
  Filled 2018-02-03: qty 15

## 2018-02-03 MED ORDER — BENZONATATE 100 MG PO CAPS: 100.0000 mg | ORAL_CAPSULE | Freq: Three times a day (TID) | ORAL | 0 refills | Status: DC

## 2018-02-03 NOTE — ED Triage Notes (Signed)
Pt reports sore throat and cough that started today.

## 2018-02-03 NOTE — ED Provider Notes (Signed)
[facility]-EMERGENCY DEPT Provider Note   CSN: [id number] Arrival date & time: 02/02/18  2324     History   Chief Complaint Chief Complaint  Patient presents with  . Sore Throat  . Cough    HPI Kelsey Lynch is a 32 y.o. female.  The history is provided by the patient.  Sore Throat  This is a new problem. The current episode started 6 to 12 hours ago. The problem occurs constantly. The problem has not changed since onset.Pertinent negatives include no chest pain, no abdominal pain, no headaches and no shortness of breath. Nothing aggravates the symptoms. Nothing relieves the symptoms. She has tried nothing for the symptoms. The treatment provided no relief.  Cough  This is a new problem. The current episode started 6 to 12 hours ago. The problem occurs every few minutes. The problem has not changed since onset.The cough is non-productive. There has been no fever. Associated symptoms include weight loss, rhinorrhea and sore throat. Pertinent negatives include no chest pain, no chills, no sweats, no headaches, no myalgias and no shortness of breath. She has tried nothing for the symptoms. The treatment provided no relief. Risk factors: none. Her past medical history does not include pneumonia or COPD.    Past Medical History:  Diagnosis Date  . Back pain, chronic   . Headache(784.0)    Migraines  . 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 Infection    UTI  . STD (sexually transmitted disease)    herpes  . Vaginal Pap smear, abnormal    bx and trx    Patient Active Problem List   Diagnosis Date Noted  . Low back pain 07/19/2015  . Abnormality of gait 07/19/2015  . Neck pain 07/19/2015  . Premature rupture of membranes (PROM), onset of labor before 24 hours, antepartum 07/17/2014  . 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 Cervical dysplasia, mild 06/03/2012  . ASCUS with positive high risk HPV 05/14/2012    Past Surgical History:    Procedure Laterality Date  . NO PAST SURGERIES       OB History    Gravida  1   Para  1   Term  1   Preterm      AB      Living  1     SAB      TAB      Ectopic      Multiple  0   Live Births  1            Home Medications    Prior to Admission medications   Medication Sig Start Date End Date Taking? Authorizing Provider  methocarbamol (ROBAXIN) 500 MG tablet Take 2 tablets (1,000 mg total) by mouth 4 (four) times daily. 07/03/15   Renne Crigler, PA-C  Norethin Ace-Eth Estrad-FE (MICROGESTIN FE 1/20 PO) Take 1 tablet by mouth daily.    [provider]  topiramate (TOPAMAX) 25 MG tablet  07/28/15   [provider]    Family History Family History  Problem Relation Age of Onset  . Diabetes Paternal Grandmother   . Hypertension Paternal Grandmother     Social History Social History   Tobacco Use  . Smoking status: Current Some Day Smoker    Types: Cigarettes  . Smokeless tobacco: Never Used  . Tobacco comment: Smokes 1-2 times per month.  Substance Use Topics  . Alcohol use: Yes    Alcohol/week: 0.0 standard drinks  Comment: Socially  . Drug use: No     Allergies   Shellfish allergy; Duloxetine; Penicillins; and Meloxicam   Review of Systems Review of Systems  Constitutional: Positive for weight loss. Negative for chills and fever.  HENT: Positive for congestion, rhinorrhea and sore throat. Negative for tinnitus, trouble swallowing and voice change.   Respiratory: Positive for cough. Negative for chest tightness and shortness of breath.   Cardiovascular: Negative for chest pain, palpitations and leg swelling.  Gastrointestinal: Negative for abdominal pain.  Musculoskeletal: Negative for myalgias, neck pain and neck stiffness.  Skin: Negative for rash.  Neurological: Negative for headaches.  All other systems reviewed and are negative.    Physical Exam Updated Vital Signs BP 114/65   Pulse 78   Temp 99.6 F (37.6  C) (Oral)   Resp 18   Ht 5\' 2"  (1.575 m)   Wt 61.2 kg   SpO2 100%   BMI 24.69 kg/m   Physical Exam  Constitutional: She is oriented to person, place, and time. She appears well-developed and well-nourished. No distress.  HENT:  Head: Normocephalic and atraumatic.  Right Ear: External ear normal.  Left Ear: External ear normal.  Nose: Nose normal.  Mouth/Throat: Oropharynx is clear and moist. No oropharyngeal exudate.  Eyes: Pupils are equal, round, and reactive to light. Conjunctivae are normal.  Neck: Normal range of motion. Neck supple.  Cardiovascular: Normal rate, regular rhythm, normal heart sounds and intact distal pulses.  Pulmonary/Chest: Effort normal and breath sounds normal. No stridor. No respiratory distress. She has no wheezes. She has no rales. She exhibits no tenderness.  Abdominal: Soft. Bowel sounds are normal. She exhibits no mass. There is no tenderness. There is no rebound and no guarding.  Musculoskeletal: Normal range of motion.  Neurological: She is alert and oriented to person, place, and time. She displays normal reflexes.  Skin: Skin is warm and dry. Capillary refill takes less than 2 seconds.  Psychiatric: She has a normal mood and affect.     ED Treatments / Results  Labs (all labs ordered are listed, but only abnormal results are displayed) Labs Reviewed  GROUP A STREP BY PCR    EKG None  Radiology No results found.  Procedures Procedures (including critical care time)    Final Clinical Impressions(s) / ED Diagnoses   Return for weakness, numbness, changes in vision or speech, fevers >100.4 unrelieved by medication, shortness of breath, intractable vomiting, or diarrhea, abdominal pain, Inability to tolerate liquids or food, cough, altered mental status or any concerns. No signs of systemic illness or infection. The patient is nontoxic-appearing on exam and vital signs are within normal limits.    I have reviewed the triage vital  signs and the nursing notes. Pertinent labs &imaging results that were available during my care of the patient were reviewed by me and considered in my medical decision making (see chart for details).  After history, exam, and medical workup I feel the patient has been appropriately medically screened and is safe for discharge home. Pertinent diagnoses were discussed with the patient. Patient was given return precautions.   Khrystal Jeanmarie, MD 02/03/18 0401

## 2019-02-20 ENCOUNTER — Encounter: Payer: Self-pay | Admitting: *Deleted

## 2019-02-25 LAB — OB RESULTS CONSOLE ANTIBODY SCREEN: Antibody Screen: NEGATIVE

## 2019-02-25 LAB — OB RESULTS CONSOLE ABO/RH: RH Type: POSITIVE

## 2019-02-25 LAB — OB RESULTS CONSOLE RUBELLA ANTIBODY, IGM: Rubella: IMMUNE

## 2019-02-25 LAB — OB RESULTS CONSOLE GC/CHLAMYDIA
Chlamydia: NEGATIVE
Gonorrhea: NEGATIVE

## 2019-02-25 LAB — OB RESULTS CONSOLE HEPATITIS B SURFACE ANTIGEN: Hepatitis B Surface Ag: NEGATIVE

## 2019-02-25 LAB — OB RESULTS CONSOLE RPR: RPR: NONREACTIVE

## 2019-02-25 LAB — OB RESULTS CONSOLE HIV ANTIBODY (ROUTINE TESTING): HIV: NONREACTIVE

## 2019-02-26 ENCOUNTER — Ambulatory Visit (INDEPENDENT_AMBULATORY_CARE_PROVIDER_SITE_OTHER): Payer: Medicaid Other | Admitting: *Deleted

## 2019-02-26 ENCOUNTER — Other Ambulatory Visit: Payer: Self-pay

## 2019-02-26 ENCOUNTER — Encounter: Payer: Self-pay | Admitting: Obstetrics and Gynecology

## 2019-02-26 DIAGNOSIS — G43909 Migraine, unspecified, not intractable, without status migrainosus: Secondary | ICD-10-CM | POA: Insufficient documentation

## 2019-02-26 DIAGNOSIS — D582 Other hemoglobinopathies: Secondary | ICD-10-CM

## 2019-02-26 DIAGNOSIS — N390 Urinary tract infection, site not specified: Secondary | ICD-10-CM | POA: Insufficient documentation

## 2019-02-26 NOTE — Progress Notes (Signed)
10:26 I called [facility] mobile number for her telephone visit and heard a message " the person you are calling is not available and the mailbox is full and cannot accept messages. " I called her home number and heard a message " I'm sorry your call cannot be completed at this time ; please try your call later".  Ceejay Kegley,RN 10:35  I called [facility] mobile number for her telephone visit and heard a message " the person you are calling is not available and the mailbox is full and cannot accept messages. " I called her home number and heard a message " I'm sorry your call cannot be completed at this time ; please try your call later". Will leave message for registrars to reschedule. Dim Meisinger,RN

## 2019-02-27 ENCOUNTER — Encounter: Payer: Medicaid Other | Admitting: Obstetrics and Gynecology

## 2019-03-03 ENCOUNTER — Encounter: Payer: Medicaid Other | Admitting: Obstetrics and Gynecology

## 2019-03-05 ENCOUNTER — Other Ambulatory Visit (HOSPITAL_COMMUNITY): Payer: Self-pay | Admitting: Obstetrics

## 2019-03-05 DIAGNOSIS — Z363 Encounter for antenatal screening for malformations: Secondary | ICD-10-CM

## 2019-03-05 DIAGNOSIS — G8929 Other chronic pain: Secondary | ICD-10-CM

## 2019-04-16 ENCOUNTER — Encounter (HOSPITAL_COMMUNITY): Payer: Self-pay | Admitting: Obstetrics & Gynecology

## 2019-04-16 ENCOUNTER — Inpatient Hospital Stay (HOSPITAL_COMMUNITY)
Admission: AD | Admit: 2019-04-16 | Discharge: 2019-04-16 | Disposition: A | Discharge status: Home / Self Care | Payer: Medicaid Other | Attending: Obstetrics & Gynecology | Admitting: Obstetrics & Gynecology

## 2019-04-16 ENCOUNTER — Inpatient Hospital Stay (HOSPITAL_BASED_OUTPATIENT_CLINIC_OR_DEPARTMENT_OTHER): Payer: Medicaid Other

## 2019-04-16 ENCOUNTER — Other Ambulatory Visit: Payer: Self-pay

## 2019-04-16 DIAGNOSIS — Z3A19 19 weeks gestation of pregnancy: Secondary | ICD-10-CM | POA: Diagnosis not present

## 2019-04-16 DIAGNOSIS — O200 Threatened abortion: Secondary | ICD-10-CM | POA: Insufficient documentation

## 2019-04-16 DIAGNOSIS — O368120 Decreased fetal movements, second trimester, not applicable or unspecified: Secondary | ICD-10-CM | POA: Diagnosis not present

## 2019-04-16 DIAGNOSIS — O26892 Other specified pregnancy related conditions, second trimester: Secondary | ICD-10-CM

## 2019-04-16 DIAGNOSIS — O4692 Antepartum hemorrhage, unspecified, second trimester: Secondary | ICD-10-CM

## 2019-04-16 DIAGNOSIS — Z793 Long term (current) use of hormonal contraceptives: Secondary | ICD-10-CM | POA: Insufficient documentation

## 2019-04-16 DIAGNOSIS — O4690 Antepartum hemorrhage, unspecified, unspecified trimester: Secondary | ICD-10-CM

## 2019-04-16 DIAGNOSIS — R109 Unspecified abdominal pain: Secondary | ICD-10-CM | POA: Insufficient documentation

## 2019-04-16 DIAGNOSIS — R42 Dizziness and giddiness: Secondary | ICD-10-CM | POA: Diagnosis not present

## 2019-04-16 DIAGNOSIS — Z79899 Other long term (current) drug therapy: Secondary | ICD-10-CM | POA: Insufficient documentation

## 2019-04-16 DIAGNOSIS — O4700 False labor before 37 completed weeks of gestation, unspecified trimester: Secondary | ICD-10-CM

## 2019-04-16 DIAGNOSIS — O479 False labor, unspecified: Secondary | ICD-10-CM

## 2019-04-16 DIAGNOSIS — Z6790 Unspecified blood type, Rh positive: Secondary | ICD-10-CM

## 2019-04-16 LAB — CBC
HCT: 35.7 % — ABNORMAL LOW (ref 36.0–46.0)
Hemoglobin: 11.8 g/dL — ABNORMAL LOW (ref 12.0–15.0)
MCH: 24.8 pg — ABNORMAL LOW (ref 26.0–34.0)
MCHC: 33.1 g/dL (ref 30.0–36.0)
MCV: 75.2 fL — ABNORMAL LOW (ref 80.0–100.0)
Platelets: 278 10*3/uL (ref 150–400)
RBC: 4.75 MIL/uL (ref 3.87–5.11)
RDW: 15.2 % (ref 11.5–15.5)
WBC: 13.7 10*3/uL — ABNORMAL HIGH (ref 4.0–10.5)
nRBC: 0 % (ref 0.0–0.2)

## 2019-04-16 LAB — WET PREP, GENITAL
Clue Cells Wet Prep HPF POC: NONE SEEN
Sperm: NONE SEEN
Trich, Wet Prep: NONE SEEN
Yeast Wet Prep HPF POC: NONE SEEN

## 2019-04-16 LAB — URINALYSIS, ROUTINE W REFLEX MICROSCOPIC
Bacteria, UA: NONE SEEN
Bilirubin Urine: NEGATIVE
Glucose, UA: NEGATIVE mg/dL
Hgb urine dipstick: NEGATIVE
Ketones, ur: NEGATIVE mg/dL
Leukocytes,Ua: NEGATIVE
Nitrite: NEGATIVE
Protein, ur: 30 mg/dL — AB
Specific Gravity, Urine: 1.025 (ref 1.005–1.030)
pH: 6 (ref 5.0–8.0)

## 2019-04-16 MED ORDER — COMFORT FIT MATERNITY SUPP SM MISC: 1.0000 [IU] | Freq: Every day | 0 refills | Status: DC | PRN

## 2019-04-16 NOTE — Discharge Instructions (Signed)
PREGNANCY SUPPORT BELT: You are not alone, Seventy-five percent of women have some sort of abdominal or back pain at some point in their pregnancy. Your baby is growing at a fast pace, which means that your whole body is rapidly trying to adjust to the changes. As your uterus grows, your back may start feeling a bit under stress and this can result in back or abdominal pain that can go from mild, and therefore bearable, to severe pains that will not allow you to sit or lay down comfortably, When it comes to dealing with pregnancy-related pains and cramps, some pregnant women usually prefer natural remedies, which the market is filled with nowadays. For example, wearing a pregnancy support belt can help ease and lessen your discomfort and pain. WHAT ARE THE BENEFITS OF WEARING A PREGNANCY SUPPORT BELT? A pregnancy support belt provides support to the lower portion of the belly taking some of the weight of the growing uterus and distributing to the other parts of your body. It is designed make you comfortable and gives you extra support. Over the years, the pregnancy apparel market has been studying the needs and wants of pregnant women and they have come up with the most comfortable pregnancy support belts that woman could ever ask for. In fact, you will no longer have to wear a stretched-out or bulky pregnancy belt that is visible underneath your clothes and makes you feel even more uncomfortable. Nowadays, a pregnancy support belt is made of comfortable and stretchy materials that will not irritate your skin but will actually make you feel at ease and you will not even notice you are wearing it. They are easy to put on and adjust during the day and can be worn at night for additional support.  BENEFITS: . Relives Back pain . Relieves Abdominal Muscle and Leg Pain . Stabilizes the Pelvic Ring . Offers a Cushioned Abdominal Lift Pad . Relieves pressure on the Sciatic Nerve Within Minutes WHERE TO GET  YOUR PREGNANCY BELT: [employer] [phone] @[id number]  [address] [place], [place]     Vaginal Bleeding During Pregnancy, Second Trimester  A small amount of bleeding (spotting) from the vagina is relatively common during pregnancy. It usually stops on its own. Various things can cause spotting during pregnancy. Sometimes the bleeding is normal and is not a sign of a problem in the pregnancy. However, bleeding can also be a sign of something serious. Be sure to tell your health care provider about any vaginal bleeding right away. Some possible causes of vaginal bleeding during the second trimester include:  Infection, inflammation, or growths (polyps) on the cervix.  A condition in which the placenta partially or completely covers the opening of the cervix inside the uterus (placenta previa).  The placenta separating from the uterus (placenta abruption).  Early (preterm) labor.  The cervix opening and thinning before pregnancy is at term and before labor starts (cervical insufficiency).  A mass of tissue developing in the uterus due to an egg being fertilized incorrectly (molar pregnancy). Follow these instructions at home: Activity  Follow instructions from your health care provider about limiting your activity. Ask what activities are safe for you.  If needed, make plans for someone to help with your regular activities.  Do not exercise or do activities that take a lot of effort unless your health care provider approves.  Do not lift anything that is heavier than 10 lb (4.5 kg), or the limit that your health  care provider tells you, until he or she says that it is safe.  Do not have sex or orgasms until your health care provider says that this is safe. Medicines  Take over-the-counter and prescription medicines only as told by your health care provider.  Do not take aspirin because it can cause bleeding. General instructions  Pay attention to any  changes in your symptoms.  Write down how many pads you use each day, how often you change pads, and how soaked (saturated) they are.  Do not use tampons or douche.  If you pass any tissue from your vagina, save the tissue so you can show it to your health care provider.  Keep all follow-up visits as told by your health care provider. This is important. Contact a health care provider if:  You have vaginal bleeding during any time of your pregnancy.  You have cramps or labor pains.  You have a fever that does not get better when you take medicines. Get help right away if:  You have severe cramps in your back or abdomen.  You have contractions.  You have chills.  You pass large clots or a large amount of tissue from your vagina.  Your bleeding increases.  You feel light-headed or weak, or you faint.  You are leaking fluid or have a gush of fluid from your vagina. Summary  Various things can cause bleeding or spotting in pregnancy.  Be sure to tell your health care provider about any vaginal bleeding right away.  Follow instructions from your health care provider about limiting your activity. Ask what activities are safe for you. This information is not intended to replace advice given to you by your health care provider. Make sure you discuss any questions you have with your health care provider. Document Released: 01/25/2005 Document Revised: 08/06/2018 Document Reviewed: 07/20/2016 Elsevier Patient Education  2020 [employer]   Abdominal Pain During Pregnancy  Abdominal pain is common during pregnancy, and has many possible causes. Some causes are more serious than others, and sometimes the cause is not known. Abdominal pain can be a sign that labor is starting. It can also be caused by normal growth and stretching of muscles and ligaments during pregnancy. Always tell your health care provider if you have any abdominal pain. Follow these instructions at home:  Do  not have sex or put anything in your vagina until your pain goes away completely.  Get plenty of rest until your pain improves.  Drink enough fluid to keep your urine pale yellow.  Take over-the-counter and prescription medicines only as told by your health care provider.  Keep all follow-up visits as told by your health care provider. This is important. Contact a health care provider if:  Your pain continues or gets worse after resting.  You have lower abdominal pain that: ? Comes and goes at regular intervals. ? Spreads to your back. ? Is similar to menstrual cramps.  You have pain or burning when you urinate. Get help right away if:  You have a fever or chills.  You have vaginal bleeding.  You are leaking fluid from your vagina.  You are passing tissue from your vagina.  You have vomiting or diarrhea that lasts for more than 24 hours.  Your baby is moving less than usual.  You feel very weak or faint.  You have shortness of breath.  You develop severe pain in your upper abdomen. Summary  Abdominal pain is common during pregnancy, and has  many possible causes.  If you experience abdominal pain during pregnancy, tell your health care provider right away.  Follow your health care provider's home care instructions and keep all follow-up visits as directed. This information is not intended to replace advice given to you by your health care provider. Make sure you discuss any questions you have with your health care provider. Document Released: 04/17/2005 Document Revised: 08/05/2018 Document Reviewed: 07/20/2016 Elsevier Patient Education  2020 [employer].  Preterm Labor and Birth Information  The normal length of a pregnancy is 39-41 weeks. Preterm labor is when labor starts before 37 completed weeks of pregnancy. What are the risk factors for preterm labor? Preterm labor is more likely to occur in women who:  Have certain infections during pregnancy such as a  bladder infection, sexually transmitted infection, or infection inside the uterus (chorioamnionitis).  Have a shorter-than-normal cervix.  Have gone into preterm labor before.  Have had surgery on their cervix.  Are younger than age 5 or older than age 38.  Are African American.  Are pregnant with twins or multiple babies (multiple gestation).  Take street drugs or smoke while pregnant.  Do not gain enough weight while pregnant.  Became pregnant shortly after having been pregnant. What are the symptoms of preterm labor? Symptoms of preterm labor include:  Cramps similar to those that can happen during a menstrual period. The cramps may happen with diarrhea.  Pain in the abdomen or lower back.  Regular uterine contractions that may feel like tightening of the abdomen.  A feeling of increased pressure in the pelvis.  Increased watery or bloody mucus discharge from the vagina.  Water breaking (ruptured amniotic sac). Why is it important to recognize signs of preterm labor? It is important to recognize signs of preterm labor because babies who are born prematurely may not be fully developed. This can put them at an increased risk for:  Long-term (chronic) heart and lung problems.  Difficulty immediately after birth with regulating body systems, including blood sugar, body temperature, heart rate, and breathing rate.  Bleeding in the brain.  Cerebral palsy.  Learning difficulties.  Death. These risks are highest for babies who are born before 34 weeks of pregnancy. How is preterm labor treated? Treatment depends on the length of your pregnancy, your condition, and the health of your baby. It may involve:  Having a stitch (suture) placed in your cervix to prevent your cervix from opening too early (cerclage).  Taking or being given medicines, such as: ? Hormone medicines. These may be given early in pregnancy to help support the pregnancy. ? Medicine to stop  contractions. ? Medicines to help mature the baby's lungs. These may be prescribed if the risk of delivery is high. ? Medicines to prevent your baby from developing cerebral palsy. If the labor happens before 34 weeks of pregnancy, you may need to stay in the hospital. What should I do if I think I am in preterm labor? If you think that you are going into preterm labor, call your health care provider right away. How can I prevent preterm labor in future pregnancies? To increase your chance of having a full-term pregnancy:  Do not use any tobacco products, such as cigarettes, chewing tobacco, and e-cigarettes. If you need help quitting, ask your health care provider.  Do not use street drugs or medicines that have not been prescribed to you during your pregnancy.  Talk with your health care provider before taking any herbal supplements, even  if you have been taking them regularly.  Make sure you gain a healthy amount of weight during your pregnancy.  Watch for infection. If you think that you might have an infection, get it checked right away.  Make sure to tell your health care provider if you have gone into preterm labor before. This information is not intended to replace advice given to you by your health care provider. Make sure you discuss any questions you have with your health care provider. Document Released: 07/08/2003 Document Revised: 08/09/2018 Document Reviewed: 09/08/2015 Elsevier Patient Education  2020 [place] A miscarriage is the loss of an unborn baby (fetus) before the 20th week of pregnancy. Most miscarriages happen during the first 3 months of pregnancy. Sometimes, a miscarriage can happen before a woman knows that she is pregnant. Having a miscarriage can be an emotional experience. If you have had a miscarriage, talk with your health care provider about any questions you may have about miscarrying, the grieving process, and your plans for future  pregnancy. What are the causes? A miscarriage may be caused by:  Problems with the genes or chromosomes of the fetus. These problems make it impossible for the baby to develop normally. They are often the result of random errors that occur early in the development of the baby, and are not passed from parent to child (not inherited).  Infection of the cervix or uterus.  Conditions that affect hormone balance in the body.  Problems with the cervix, such as the cervix opening and thinning before pregnancy is at term (cervical insufficiency).  Problems with the uterus. These may include: ? A uterus with an abnormal shape. ? Fibroids in the uterus. ? Congenital abnormalities. These are problems that were present at birth.  Certain medical conditions.  Smoking, drinking alcohol, or using drugs.  Injury (trauma). In many cases, the cause of a miscarriage is not known. What are the signs or symptoms? Symptoms of this condition include:  Vaginal bleeding or spotting, with or without cramps or pain.  Pain or cramping in the abdomen or lower back.  Passing fluid, tissue, or blood clots from the vagina. How is this diagnosed? This condition may be diagnosed based on:  A physical exam.  Ultrasound.  Blood tests.  Urine tests. How is this treated? Treatment for a miscarriage is sometimes not necessary if you naturally pass all the tissue that was in your uterus. If necessary, this condition may be treated with:  Dilation and curettage (D&C). This is a procedure in which the cervix is stretched open and the lining of the uterus (endometrium) is scraped. This is done only if tissue from the fetus or placenta remains in the body (incomplete miscarriage).  Medicines, such as: ? Antibiotic medicine, to treat infection. ? Medicine to help the body pass any remaining tissue. ? Medicine to reduce (contract) the size of the uterus. These medicines may be given if you have a lot of  bleeding. If you have Rh negative blood and your baby was Rh positive, you will need a shot of a medicine called Rh immunoglobulinto protect your future babies from Rh blood problems. "Rh-negative" and "Rh-positive" refer to whether or not the blood has a specific protein found on the surface of red blood cells (Rh factor). Follow these instructions at home: Medicines   Take over-the-counter and prescription medicines only as told by your health care provider.  If you were prescribed antibiotic medicine, take it as told by your health  care provider. Do not stop taking the antibiotic even if you start to feel better.  Do not take NSAIDs, such as aspirin and ibuprofen, unless they are approved by your health care provider. These medicines can cause bleeding. Activity  Rest as directed. Ask your health care provider what activities are safe for you.  Have someone help with home and family responsibilities during this time. General instructions  Keep track of the number of sanitary pads you use each day and how soaked (saturated) they are. Write down this information.  Monitor the amount of tissue or blood clots that you pass from your vagina. Save any large amounts of tissue for your health care provider to examine.  Do not use tampons, douche, or have sex until your health care provider approves.  To help you and your partner with the process of grieving, talk with your health care provider or seek counseling.  When you are ready, meet with your health care provider to discuss any important steps you should take for your health. Also, discuss steps you should take to have a healthy pregnancy in the future.  Keep all follow-up visits as told by your health care provider. This is important. Where to find more information  The American Congress of Obstetricians and Gynecologists: www.acog.org  [place]. Department of Health and Cytogeneticist of Women's Health:  http://hoffman.com/ Contact a health care provider if:  You have a fever or chills.  You have a foul smelling vaginal discharge.  You have more bleeding instead of less. Get help right away if:  You have severe cramps or pain in your back or abdomen.  You pass blood clots or tissue from your vagina that is walnut-sized or larger.  You soak more than 1 regular sanitary pad in an hour.  You become light-headed or weak.  You pass out.  You have feelings of sadness that take over your thoughts, or you have thoughts of hurting yourself. Summary  Most miscarriages happen in the first 3 months of pregnancy. Sometimes miscarriage happens before a woman even knows that she is pregnant.  Follow your health care provider's instruction for home care. Keep all follow-up appointments.  To help you and your partner with the process of grieving, talk with your health care provider or seek counseling. This information is not intended to replace advice given to you by your health care provider. Make sure you discuss any questions you have with your health care provider. Document Released: 10/11/2000 Document Revised: 08/09/2018 Document Reviewed: 05/23/2016 Elsevier Patient Education  2020 [employer].

## 2019-04-16 NOTE — MAU Note (Signed)
abd pain, cramping and tightens, started this morning..  Woke up this morning, lightly spotting.  Just woke up from a nap, and it was like a period.  Hasn't felt the baby move last night or this morning.  No placental issues on [place]

## 2019-04-16 NOTE — MAU Provider Note (Signed)
History     CSN: [id number]  Arrival date and time: 04/16/19 1715   First Provider Initiated Contact with Patient 04/16/19 1811      Chief Complaint  Patient presents with  . Vaginal Bleeding  . Decreased Fetal Movement  . Abdominal Pain   [name] is a 33 y.o. G2P1001 at 5022w6d who presents to MAU for vaginal bleeding which began this morning. Pt describes bleeding as spotting, which increased to bleeding "like a period" this afternoon. Pt reports she called her OB and was told to come to MAU for evaluation. Pt also reports she has not felt the baby move as much as usual.  Passing blood clots? few minute Blood soaking clothes? no Lightheaded/dizzy? no Significant pelvic pain or cramping/ctx? yes, pt reports intermittent cramping and tightening sensation that she reports as frequent, but has not timed them, rates pain as 9/10 when it occurs, 0/10 when pain is not present Passed any tissue? no  Hx of PID, GYN surgery? no Current pregnancy problems? ?cHTN Blood Type? B Positive Allergies? Shellfish, PCN, meloxicam Current medications? Diclegis, PNVs, Pepcid, Valtrex [facility]exact date unknown[facility]   OB History    Gravida  2   Para  1   Term  1   Preterm      AB      Living  1     SAB      TAB      Ectopic      Multiple  0   Live Births  1           Past Medical History:  Diagnosis Date  . Back pain, chronic   . Depression   . Headache(784.0)    Migraines  . Hemoglobin Constant Spring trait   . Hemoglobin Constant Spring trait   . 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 Infection    UTI  . Neck pain   . STD (sexually transmitted disease)    herpes  . Vaginal Pap smear, abnormal    bx and trx    Past Surgical History:  Procedure Laterality Date  . NO PAST SURGERIES      Family History  Problem Relation Age of Onset  . Diabetes Paternal Grandmother   . Hypertension Paternal  Grandmother     Social History   Tobacco Use  . Smoking status: Former Smoker    Types: Cigarettes  . Smokeless tobacco: Never Used  . Tobacco comment: Smokes 1-2 times per month.  Substance Use Topics  . Alcohol use: Yes    Alcohol/week: 0.0 standard drinks    Comment: Socially  . Drug use: No    Allergies:  Allergies  Allergen Reactions  . Shellfish Allergy Anaphylaxis and Swelling  . Penicillins Hives    Has patient had a PCN reaction causing immediate rash, facial/tongue/throat swelling, SOB or lightheadedness with hypotension: Yes  Has patient had a PCN reaction causing severe rash involving mucus membranes or skin necrosis: Yes Has patient had a PCN reaction that required hospitalization No Has patient had a PCN reaction occurring within the last 10 years: Yes  If all of the above answers are "NO", then may proceed with Cephalosporin use.   . Meloxicam Rash    Medications Prior to Admission  Medication Sig Dispense Refill Last Dose  . benzonatate (TESSALON) 100 MG capsule Take 1 capsule (100 mg total) by mouth every 8 (eight) hours. 21 capsule 0   .  famotidine (PEPCID) 20 MG tablet Take 20 mg by mouth 2 (two) times daily.   04/15/2019 at 1900  . prenatal vitamin w/FE, FA (PRENATAL 1 + 1) 27-1 MG TABS tablet Take 1 tablet by mouth daily at 12 noon.   04/15/2019 at 1930  . valACYclovir (VALTREX) 500 MG tablet Take 500 mg by mouth daily.   04/14/2019  . methocarbamol (ROBAXIN) 500 MG tablet Take 2 tablets (1,000 mg total) by mouth 4 (four) times daily. 30 tablet 0   . Norethin Ace-Eth Estrad-FE (MICROGESTIN FE 1/20 PO) Take 1 tablet by mouth daily.     Marland Kitchen topiramate (TOPAMAX) 25 MG tablet        Review of Systems  Constitutional: Negative for chills, diaphoresis, fatigue and fever.  Eyes: Negative for visual disturbance.  Respiratory: Negative for shortness of breath.   Cardiovascular: Negative for chest pain.  Gastrointestinal: Positive for abdominal pain. Negative  for constipation, diarrhea, nausea and vomiting.  Genitourinary: Positive for vaginal bleeding. Negative for dysuria, flank pain, frequency, pelvic pain, urgency and vaginal discharge.  Neurological: Negative for dizziness, weakness, light-headedness and headaches.   Physical Exam   Blood pressure 114/78, pulse 88, temperature 97.6 F (36.4 C), temperature source Oral, resp. rate 18, height  (1.575 m), weight 74 kg, SpO2 100 %.  Patient Vitals for the past 24 hrs:  BP Temp Temp src Pulse Resp SpO2 Height Weight  04/16/19 1934 114/78 97.6 F (36.4 C) Oral 88 18 -- -- --  04/16/19 1727 120/78 98.1 F (36.7 C) Oral (!) 105 18 100 %  (1.575 m) 74 kg   Physical Exam  Constitutional: She is oriented to person, place, and time. She appears well-developed and well-nourished. No distress.  HENT:  Head: Normocephalic and atraumatic.  Respiratory: Effort normal.  GI: Soft. She exhibits no distension and no mass. There is no abdominal tenderness. There is no rebound and no guarding.  Genitourinary: There is no rash, tenderness or lesion on the right labia. There is no rash, tenderness or lesion on the left labia. Uterus is not tender. Cervix exhibits no motion tenderness, no discharge and no friability.    Vaginal discharge (moderate, white, thick) present.     No vaginal tenderness or bleeding.  No tenderness or bleeding in the vagina.  Neurological: She is alert and oriented to person, place, and time.  Skin: Skin is warm and dry. She is not diaphoretic.  Psychiatric: She has a normal mood and affect. Her behavior is normal. Judgment and thought content normal.   Results for orders placed or performed during the hospital encounter of 04/16/19 (from the past 24 hour(s))  Wet prep, genital     Status: Abnormal   Collection Time: 04/16/19  6:22 PM   Specimen: Cervix  Result Value Ref Range   Yeast Wet Prep HPF POC NONE SEEN NONE SEEN   Trich, Wet Prep NONE SEEN NONE SEEN   Clue Cells  Wet Prep HPF POC NONE SEEN NONE SEEN   WBC, Wet Prep HPF POC MANY (A) NONE SEEN   Sperm NONE SEEN   CBC     Status: Abnormal   Collection Time: 04/16/19  6:31 PM  Result Value Ref Range   WBC 13.7 (H) 4.0 - 10.5 K/uL   RBC 4.75 3.87 - 5.11 MIL/uL   Hemoglobin 11.8 (L) 12.0 - 15.0 g/dL   HCT 47.8 (L) 29.5 - 62.1 %   MCV 75.2 (L) 80.0 - 100.0 fL   MCH 24.8 (  L) 26.0 - 34.0 pg   MCHC 33.1 30.0 - 36.0 g/dL   RDW 95.2 84.1 - 32.4 %   Platelets 278 150 - 400 K/uL   nRBC 0.0 0.0 - 0.2 %  Urinalysis, Routine w reflex microscopic     Status: Abnormal   Collection Time: 04/16/19  7:15 PM  Result Value Ref Range   Color, Urine YELLOW YELLOW   APPearance HAZY (A) CLEAR   Specific Gravity, Urine 1.025 1.005 - 1.030   pH 6.0 5.0 - 8.0   Glucose, UA NEGATIVE NEGATIVE mg/dL   Hgb urine dipstick NEGATIVE NEGATIVE   Bilirubin Urine NEGATIVE NEGATIVE   Ketones, ur NEGATIVE NEGATIVE mg/dL   Protein, ur 30 (A) NEGATIVE mg/dL   Nitrite NEGATIVE NEGATIVE   Leukocytes,Ua NEGATIVE NEGATIVE   RBC / HPF 0-5 0 - 5 RBC/hpf   WBC, UA 0-5 0 - 5 WBC/hpf   Bacteria, UA NONE SEEN NONE SEEN   Squamous Epithelial / LPF 0-5 0 - 5   Mucus PRESENT    No results found.  MAU Course  Procedures  MDM -r/o previable miscarriage (VB+intermittent abdominal pain) -no bleeding on exam, intermittent ctx per pt report -FHT 156 -UA: hazy/30PRO, sending urine for culture based on symptoms -CBC: WNL for pregnancy -[place]: arrythmia noted, posterior placenta, AFI WNL, CL 3.17cm -ABO: B Positive -WetPrep: WNL -GC/CT collected -CE: external os open 1cm/internal os closed/cervix midline -consulted with Dr. Shawnie Pons, pt OK to be discharged to home without additional intervention, but with close follow-up and strict return precautions -pt discharged to home in stable condition  Orders Placed This Encounter  Procedures  . Wet prep, genital    Standing Status:   Standing    Number of Occurrences:   1  . Culture, OB Urine     Standing Status:   Standing    Number of Occurrences:   1  . [place] MFM OB LIMITED    Standing Status:   Standing    Number of Occurrences:   1    Order Specific Question:   Symptom/Reason for Exam    Answer:   Vaginal bleeding in pregnancy [[id number]]  . Urinalysis, Routine w reflex microscopic    Standing Status:   Standing    Number of Occurrences:   1  . CBC    Standing Status:   Standing    Number of Occurrences:   1  . Discharge patient    Order Specific Question:   Discharge disposition    Answer:   01-Home or Self Care [1]    Order Specific Question:   Discharge patient date    Answer:   04/16/2019   Meds ordered this encounter  Medications  . Elastic Bandages & Supports (COMFORT FIT MATERNITY SUPP SM) MISC    Sig: 1 Units by Does not apply route daily as needed.    Dispense:  1 each    Refill:  0    Order Specific Question:   Supervising Provider    Answer:   Adam Phenix [[id number]]   Assessment and Plan   1. Threatened miscarriage   2. Vaginal bleeding in pregnancy   3. [redacted] weeks gestation of pregnancy   4. Blood type, Rh positive   5. Preterm uterine contractions   6. Decreased fetal movements in second trimester, single or unspecified fetus    Allergies as of 04/16/2019      Reactions   Shellfish Allergy Anaphylaxis, Swelling   Penicillins Hives   Has  patient had a PCN reaction causing immediate rash, facial/tongue/throat swelling, SOB or lightheadedness with hypotension: Yes  Has patient had a PCN reaction causing severe rash involving mucus membranes or skin necrosis: Yes Has patient had a PCN reaction that required hospitalization No Has patient had a PCN reaction occurring within the last 10 years: Yes  If all of the above answers are "NO", then may proceed with Cephalosporin use.   Meloxicam Rash      Medication List    STOP taking these medications   methocarbamol 500 MG tablet Commonly known as: ROBAXIN   MICROGESTIN FE 1/20 PO     TAKE these  medications   benzonatate 100 MG capsule Commonly known as: TESSALON Take 1 capsule (100 mg total) by mouth every 8 (eight) hours.   Comfort Fit Maternity Supp Sm Misc 1 Units by Does not apply route daily as needed.   famotidine 20 MG tablet Commonly known as: PEPCID Take 20 mg by mouth 2 (two) times daily.   prenatal vitamin w/FE, FA 27-1 MG Tabs tablet Take 1 tablet by mouth daily at 12 noon.   Topamax 25 MG tablet Generic drug: topiramate   valACYclovir 500 MG tablet Commonly known as: VALTREX Take 500 mg by mouth daily.      -discussed with patient that she may be experiencing uterine contractions that may result in a miscarriage, for which we would not be able to intervene prior to 23wks -called [employer] after-hours line and left message requesting patient be seen in office for repeat exam by Friday -also called Dr. Shellia Cleverly and notified of patient's symptoms and need for close follow-up @746PM . Dr. Shellia Cleverly agrees with plan and will send note to office to have patient f/u on Friday. -pt advised to call office on Thursday afternoon for appointment, if she does not hear from anyone sooner. -work note given for today and also note given for work restrictions, NOT to be out of work -discussed that mothers do not perceive 100% of fetal movement, and there is wide variation of mother perception of fetal movement -discussed that fetal movement varies somewhat depending on the time of day and gestational age and there is a wide variety of normal movement among healthy babies -discussed that patient should not expect regular movement until 28wks -discussed that frequency of movement typically increases from morning to night, with peak activity late at night -discussed that fetal sleep cycles become longer with advancing gestation and can last about 20-5minutes -strict bleeding/pain/PTL/return MAU precautions given -pt discharged to home in stable condition  Gerrie Nordmann  Laraya Pestka 04/16/2019, 7:56 PM

## 2019-04-17 LAB — GC/CHLAMYDIA PROBE AMP (~~LOC~~) NOT AT ARMC
Chlamydia: NEGATIVE
Comment: NEGATIVE
Comment: NORMAL
Neisseria Gonorrhea: NEGATIVE

## 2019-04-17 LAB — CULTURE, OB URINE: Culture: <10000 — AB

## 2019-04-22 ENCOUNTER — Encounter (HOSPITAL_COMMUNITY): Payer: Self-pay

## 2019-04-28 ENCOUNTER — Encounter (HOSPITAL_COMMUNITY): Payer: Self-pay

## 2019-04-28 ENCOUNTER — Ambulatory Visit (HOSPITAL_COMMUNITY): Payer: Medicaid Other | Admitting: *Deleted

## 2019-04-28 ENCOUNTER — Ambulatory Visit (HOSPITAL_COMMUNITY)
Admission: RE | Admit: 2019-04-28 | Discharge: 2019-04-28 | Disposition: A | Discharge status: Home / Self Care | Payer: Medicaid Other | Source: Ambulatory Visit | Attending: Obstetrics and Gynecology | Admitting: Obstetrics and Gynecology

## 2019-04-28 ENCOUNTER — Other Ambulatory Visit: Payer: Self-pay

## 2019-04-28 VITALS — BP 127/83 | HR 110 | Temp 97.8°F

## 2019-04-28 DIAGNOSIS — O3442 Maternal care for other abnormalities of cervix, second trimester: Secondary | ICD-10-CM

## 2019-04-28 DIAGNOSIS — O0990 Supervision of high risk pregnancy, unspecified, unspecified trimester: Secondary | ICD-10-CM

## 2019-04-28 DIAGNOSIS — Z3A21 21 weeks gestation of pregnancy: Secondary | ICD-10-CM

## 2019-04-28 DIAGNOSIS — F1920 Other psychoactive substance dependence, uncomplicated: Secondary | ICD-10-CM

## 2019-04-28 DIAGNOSIS — B009 Herpesviral infection, unspecified: Secondary | ICD-10-CM

## 2019-04-28 DIAGNOSIS — O99322 Drug use complicating pregnancy, second trimester: Secondary | ICD-10-CM | POA: Diagnosis not present

## 2019-04-28 DIAGNOSIS — G8929 Other chronic pain: Secondary | ICD-10-CM | POA: Diagnosis not present

## 2019-04-28 DIAGNOSIS — F1120 Opioid dependence, uncomplicated: Secondary | ICD-10-CM | POA: Diagnosis not present

## 2019-04-28 DIAGNOSIS — O99412 Diseases of the circulatory system complicating pregnancy, second trimester: Secondary | ICD-10-CM

## 2019-04-28 DIAGNOSIS — Z363 Encounter for antenatal screening for malformations: Secondary | ICD-10-CM | POA: Insufficient documentation

## 2019-04-28 DIAGNOSIS — O358XX0 Maternal care for other (suspected) fetal abnormality and damage, not applicable or unspecified: Secondary | ICD-10-CM

## 2019-04-28 DIAGNOSIS — O98512 Other viral diseases complicating pregnancy, second trimester: Secondary | ICD-10-CM

## 2019-04-28 DIAGNOSIS — I499 Cardiac arrhythmia, unspecified: Secondary | ICD-10-CM

## 2019-04-28 HISTORY — DX: Opioid dependence, in remission: F11.21

## 2019-04-28 NOTE — Consult Note (Signed)
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  She is in constant pain and requires 5 mg of oxycodone every 4-6 hours for pain relief.  She denies any other significant past medical history and denies any problems in her current pregnancy.    The patient has not had any screening tests for fetal aneuploidy drawn in her current pregnancy.  She was informed that the fetal growth and amniotic fluid level were appropriate for her gestational age.   On today's exam, an intracardiac echogenic focus was noted in the left ventricle of the fetal heart.  The small association between an echogenic focus and Down syndrome was discussed. Due to the echogenic focus noted today, the patient was offered and declined an amniocentesis today for definitive diagnosis of fetal aneuploidy.  She was also offered and declined a cell free DNA test to screen for fetal aneuploidy today.  The patient was informed that anomalies may be missed due to technical limitations. If the fetus is in a suboptimal position or maternal habitus is increased, visualization of the fetus in the maternal uterus may be impaired.  The implications and management of opioid use in pregnancy was discussed in detail today.  She was advised that as she cannot perform daily functions without taking pain medications, she should continue taking opioids at the lowest possible dose as recommended by her pain specialist.  Alternatives for pain management to oxycodone that may be used in pregnancy include Subutex, Suboxone, or frequent epidural injections.  She should consult with her pain specialist to determine the most optimal form of treatment in pregnancy.  It is questionable if switching her from oxycodone to Subutex or Suboxone would decrease  the risk of neonatal withdrawal or the neonatal abstinence syndrome.  She was advised that her baby may require a prolonged hospitalization to be weaned off opioids after delivery.  She was advised that most women who require opioid treatment in pregnancy usually stay in the hospital with their baby for 5 to 7 days using nonpharmacologic treatment, performing skin to skin therapy while rooming in with the infant to help manage neonatal withdrawal symptoms.  Consideration should be given to a consult with the NICU to discuss this form of treatment prior to delivery.   As patients who are treated with chronic opioids may require higher doses of opioid medications to reach a therapeutic analgesic effect in labor, an anesthesia consult should be requested on admission to labor and delivery to determine the appropriate pain relief in labor.    Due to maternal treatment with opioids, she should continue to be followed with serial growth ultrasounds.  These ultrasounds may be performed in your office or ours.  Weekly fetal testing should be started at around 32 weeks.  A total of 30 minutes was spent counseling and coordinating the care for this patient.  Greater than 50% of the time was spent in direct face-to-face contact.

## 2019-05-02 NOTE — L&D Delivery Note (Signed)
Operative Delivery Note At 2:30 PM a viable female was delivered via Vaginal, Spontaneous.  Presentation: vertex; Position: Left,, Occiput,, Anterior; Station: +5.  Delivery of the head: 09/06/2019  2:29 PM First maneuver: 09/06/2019  2:29 PM, McRoberts Second maneuver: 09/06/2019  2:29 PM, Suprapubic Pressure Third maneuver: 09/06/2019  2:30 PM,  Joseph Art screw Fourth maneuver: 09/06/2019  2:30 PM, Posterior arm delivered  The patient delivered the fetal head to crowning.  With crowning of the head a loose nuchal cord was identified and reduced.  The first attempt to deliver the anterior shoulders was unsuccessful and a shoulder dystocia was called.  The patient was placed in McRoberts and suprapubic pressure was applied.  The first 2 attempts to deliver the anterior shoulder with McRoberts and suprapubic pressure were not successful.  A wood screw maneuver was attempted and McRoberts and suprapubic were again attempted without success.  Finally the posterior arm was delivered and finally the anterior shoulder delivered followed by the body.  The total time of shoulder dystocia was 1 minute 20 seconds.  The infant was placed on the maternal abdomen but was noted to be limp and in shock.  The decision was made to clamp the cord cut and pass to the waiting team for resuscitation.  The NICU was called for delivery.  The patient spontaneously delivered her placenta intact.  A second-degree perineal laceration was repaired with 3-0 Vicryl.  The baby was transferred to the NICU.  The mother will be transferred to couplet care once postpartum recovery is completed.    Verbal consent: obtained from patient.  APGAR: 2, ; weight 8 lb 0.2 oz (3635 g).   Placenta status: spontaneous, intact   Cord: 3V with nuchal x 1  Anesthesia:  Epidural Episiotomy: None Lacerations: 2nd degree Suture Repair: 3.0 Est. Blood Loss (mL): 557  Mom to postpartum.  Baby to Couplet care / Skin to Skin.  Waynard Reeds 09/06/2019, 3:08  PM

## 2019-08-12 LAB — OB RESULTS CONSOLE GBS: GBS: POSITIVE

## 2019-08-25 ENCOUNTER — Telehealth (HOSPITAL_COMMUNITY): Payer: Self-pay | Admitting: *Deleted

## 2019-08-25 NOTE — Telephone Encounter (Signed)
Explained to pt that I did not need to talk with her at present due to no Induction scheduled.  I spoke with Inetta Fermo NICU charge nurse and gave her the patient name and MRN regarding preadmission education.

## 2019-09-01 ENCOUNTER — Other Ambulatory Visit: Payer: Self-pay | Admitting: Obstetrics and Gynecology

## 2019-09-01 ENCOUNTER — Telehealth (HOSPITAL_COMMUNITY): Payer: Self-pay | Admitting: *Deleted

## 2019-09-02 NOTE — Telephone Encounter (Signed)
Preadmission screen  

## 2019-09-03 ENCOUNTER — Telehealth (HOSPITAL_COMMUNITY): Payer: Self-pay | Admitting: *Deleted

## 2019-09-03 NOTE — Telephone Encounter (Signed)
Preadmission screen  

## 2019-09-04 ENCOUNTER — Telehealth (HOSPITAL_COMMUNITY): Payer: Self-pay | Admitting: *Deleted

## 2019-09-04 NOTE — Telephone Encounter (Signed)
Preadmission screen  

## 2019-09-05 ENCOUNTER — Inpatient Hospital Stay (HOSPITAL_COMMUNITY)
Admission: AD | Admit: 2019-09-05 | Discharge: 2019-09-08 | DRG: 807 | Disposition: A | Discharge status: Home / Self Care | Payer: Medicaid Other | Attending: Obstetrics and Gynecology | Admitting: Obstetrics and Gynecology

## 2019-09-05 ENCOUNTER — Other Ambulatory Visit: Payer: Self-pay

## 2019-09-05 ENCOUNTER — Encounter (HOSPITAL_COMMUNITY): Payer: Self-pay | Admitting: Obstetrics and Gynecology

## 2019-09-05 DIAGNOSIS — O480 Post-term pregnancy: Secondary | ICD-10-CM | POA: Diagnosis present

## 2019-09-05 DIAGNOSIS — Z3A40 40 weeks gestation of pregnancy: Secondary | ICD-10-CM | POA: Diagnosis not present

## 2019-09-05 DIAGNOSIS — Z87891 Personal history of nicotine dependence: Secondary | ICD-10-CM

## 2019-09-05 DIAGNOSIS — O99824 Streptococcus B carrier state complicating childbirth: Secondary | ICD-10-CM | POA: Diagnosis present

## 2019-09-05 DIAGNOSIS — Z20822 Contact with and (suspected) exposure to covid-19: Secondary | ICD-10-CM | POA: Diagnosis present

## 2019-09-05 LAB — TYPE AND SCREEN
ABO/RH(D): B POS
Antibody Screen: NEGATIVE

## 2019-09-05 LAB — RESPIRATORY PANEL BY RT PCR (FLU A&B, COVID)
Influenza A by PCR: NEGATIVE
Influenza B by PCR: NEGATIVE
SARS Coronavirus 2 by RT PCR: NEGATIVE

## 2019-09-05 LAB — CBC
HCT: 36.5 % (ref 36.0–46.0)
Hemoglobin: 11 g/dL — ABNORMAL LOW (ref 12.0–15.0)
MCH: 22.2 pg — ABNORMAL LOW (ref 26.0–34.0)
MCHC: 30.1 g/dL (ref 30.0–36.0)
MCV: 73.6 fL — ABNORMAL LOW (ref 80.0–100.0)
Platelets: 324 10*3/uL (ref 150–400)
RBC: 4.96 MIL/uL (ref 3.87–5.11)
RDW: 17.6 % — ABNORMAL HIGH (ref 11.5–15.5)
WBC: 12.3 10*3/uL — ABNORMAL HIGH (ref 4.0–10.5)
nRBC: 0 % (ref 0.0–0.2)

## 2019-09-05 LAB — ABO/RH: ABO/RH(D): B POS

## 2019-09-05 MED ORDER — OXYTOCIN BOLUS FROM INFUSION
500.0000 mL | Freq: Once | INTRAVENOUS | Status: AC
  Administered 2019-09-06: 500 mL via INTRAVENOUS

## 2019-09-05 MED ORDER — ACETAMINOPHEN 325 MG PO TABS
650.0000 mg | ORAL_TABLET | ORAL | Status: DC | PRN
  Administered 2019-09-06: 650 mg via ORAL
  Filled 2019-09-05: qty 2

## 2019-09-05 MED ORDER — PHENYLEPHRINE 40 MCG/ML (10ML) SYRINGE FOR IV PUSH (FOR BLOOD PRESSURE SUPPORT): 80.0000 ug | PREFILLED_SYRINGE | INTRAVENOUS | Status: DC | PRN

## 2019-09-05 MED ORDER — LACTATED RINGERS IV SOLN: INTRAVENOUS | Status: DC

## 2019-09-05 MED ORDER — MISOPROSTOL 25 MCG QUARTER TABLET
25.0000 ug | ORAL_TABLET | ORAL | Status: DC | PRN
  Administered 2019-09-05 – 2019-09-06 (×2): 25 ug via VAGINAL
  Filled 2019-09-05 (×3): qty 1

## 2019-09-05 MED ORDER — VANCOMYCIN HCL IN DEXTROSE 1-5 GM/200ML-% IV SOLN
1000.0000 mg | Freq: Two times a day (BID) | INTRAVENOUS | Status: DC
  Administered 2019-09-05 – 2019-09-06 (×2): 1000 mg via INTRAVENOUS
  Filled 2019-09-05 (×2): qty 200

## 2019-09-05 MED ORDER — EPHEDRINE 5 MG/ML INJ: 10.0000 mg | INTRAVENOUS | Status: DC | PRN

## 2019-09-05 MED ORDER — SOD CITRATE-CITRIC ACID 500-334 MG/5ML PO SOLN
30.0000 mL | ORAL | Status: DC | PRN
  Administered 2019-09-05 – 2019-09-06 (×2): 30 mL via ORAL
  Filled 2019-09-05 (×2): qty 30

## 2019-09-05 MED ORDER — OXYCODONE HCL 5 MG PO TABS
5.0000 mg | ORAL_TABLET | Freq: Four times a day (QID) | ORAL | Status: DC | PRN
  Administered 2019-09-05 – 2019-09-06 (×3): 5 mg via ORAL
  Filled 2019-09-05 (×3): qty 1

## 2019-09-05 MED ORDER — FENTANYL-BUPIVACAINE-NACL 0.5-0.125-0.9 MG/250ML-% EP SOLN
12.0000 mL/h | EPIDURAL | Status: DC | PRN
  Filled 2019-09-05: qty 250

## 2019-09-05 MED ORDER — LACTATED RINGERS IV SOLN
500.0000 mL | INTRAVENOUS | Status: DC | PRN
  Administered 2019-09-06 (×2): 500 mL via INTRAVENOUS

## 2019-09-05 MED ORDER — LACTATED RINGERS IV SOLN
500.0000 mL | Freq: Once | INTRAVENOUS | Status: AC
  Administered 2019-09-06: 500 mL via INTRAVENOUS

## 2019-09-05 MED ORDER — DIPHENHYDRAMINE HCL 50 MG/ML IJ SOLN: 12.5000 mg | INTRAMUSCULAR | Status: DC | PRN

## 2019-09-05 MED ORDER — TERBUTALINE SULFATE 1 MG/ML IJ SOLN: 0.2500 mg | Freq: Once | INTRAMUSCULAR | Status: DC | PRN

## 2019-09-05 MED ORDER — OXYTOCIN 40 UNITS IN NORMAL SALINE INFUSION - SIMPLE MED
2.5000 [IU]/h | INTRAVENOUS | Status: DC
  Filled 2019-09-05 (×2): qty 1000

## 2019-09-05 MED ORDER — LIDOCAINE HCL (PF) 1 % IJ SOLN: 30.0000 mL | INTRAMUSCULAR | Status: DC | PRN

## 2019-09-05 MED ORDER — ONDANSETRON HCL 4 MG/2ML IJ SOLN: 4.0000 mg | Freq: Four times a day (QID) | INTRAMUSCULAR | Status: DC | PRN

## 2019-09-05 MED ORDER — FENTANYL CITRATE (PF) 100 MCG/2ML IJ SOLN
50.0000 ug | INTRAMUSCULAR | Status: DC | PRN
  Filled 2019-09-05: qty 2

## 2019-09-05 NOTE — Progress Notes (Signed)
Provider called at 1600 to notify of patient coming to be direct admitted for decels of FHR. Patient did not show-Charge RN called pt at 1830 pt at home and stated she would be here as soon as possible-informed pt of importance of FHR monitoring due to findings in office-pt aware and states she is on the way.

## 2019-09-06 ENCOUNTER — Inpatient Hospital Stay (HOSPITAL_COMMUNITY): Payer: Medicaid Other | Admitting: Anesthesiology

## 2019-09-06 ENCOUNTER — Encounter (HOSPITAL_COMMUNITY): Payer: Self-pay | Admitting: Obstetrics and Gynecology

## 2019-09-06 LAB — RPR: RPR Ser Ql: NONREACTIVE

## 2019-09-06 MED ORDER — COCONUT OIL OIL: 1.0000 "application " | TOPICAL_OIL | Status: DC | PRN

## 2019-09-06 MED ORDER — METHYLERGONOVINE MALEATE 0.2 MG PO TABS: 0.2000 mg | ORAL_TABLET | ORAL | Status: DC | PRN

## 2019-09-06 MED ORDER — IBUPROFEN 600 MG PO TABS
600.0000 mg | ORAL_TABLET | Freq: Four times a day (QID) | ORAL | Status: DC
  Administered 2019-09-06 – 2019-09-08 (×8): 600 mg via ORAL
  Filled 2019-09-06 (×9): qty 1

## 2019-09-06 MED ORDER — TERBUTALINE SULFATE 1 MG/ML IJ SOLN: 0.2500 mg | Freq: Once | INTRAMUSCULAR | Status: DC | PRN

## 2019-09-06 MED ORDER — PRENATAL MULTIVITAMIN CH
1.0000 | ORAL_TABLET | Freq: Every day | ORAL | Status: DC
  Administered 2019-09-07 – 2019-09-08 (×2): 1 via ORAL
  Filled 2019-09-06 (×2): qty 1

## 2019-09-06 MED ORDER — SIMETHICONE 80 MG PO CHEW: 80.0000 mg | CHEWABLE_TABLET | ORAL | Status: DC | PRN

## 2019-09-06 MED ORDER — ACETAMINOPHEN 325 MG PO TABS
650.0000 mg | ORAL_TABLET | ORAL | Status: DC | PRN
  Administered 2019-09-06 – 2019-09-08 (×10): 650 mg via ORAL
  Filled 2019-09-06 (×11): qty 2

## 2019-09-06 MED ORDER — OXYCODONE HCL 5 MG PO TABS: 5.0000 mg | ORAL_TABLET | Freq: Four times a day (QID) | ORAL | Status: DC | PRN

## 2019-09-06 MED ORDER — LIDOCAINE HCL (PF) 1 % IJ SOLN
INTRAMUSCULAR | Status: DC | PRN
  Administered 2019-09-06 (×2): 5 mL via EPIDURAL

## 2019-09-06 MED ORDER — BENZOCAINE-MENTHOL 20-0.5 % EX AERO
1.0000 "application " | INHALATION_SPRAY | CUTANEOUS | Status: DC | PRN
  Administered 2019-09-06: 1 via TOPICAL
  Filled 2019-09-06: qty 56

## 2019-09-06 MED ORDER — METHYLERGONOVINE MALEATE 0.2 MG/ML IJ SOLN: 0.2000 mg | INTRAMUSCULAR | Status: DC | PRN

## 2019-09-06 MED ORDER — WITCH HAZEL-GLYCERIN EX PADS: 1.0000 "application " | MEDICATED_PAD | CUTANEOUS | Status: DC | PRN

## 2019-09-06 MED ORDER — DIBUCAINE (PERIANAL) 1 % EX OINT
1.0000 "application " | TOPICAL_OINTMENT | CUTANEOUS | Status: DC | PRN
  Administered 2019-09-06: 1 via RECTAL
  Filled 2019-09-06: qty 28

## 2019-09-06 MED ORDER — SODIUM CHLORIDE (PF) 0.9 % IJ SOLN
INTRAMUSCULAR | Status: DC | PRN
  Administered 2019-09-06: 12 mL/h via EPIDURAL

## 2019-09-06 MED ORDER — OXYCODONE HCL 5 MG PO TABS
5.0000 mg | ORAL_TABLET | ORAL | Status: DC | PRN
  Filled 2019-09-06: qty 1

## 2019-09-06 MED ORDER — SENNOSIDES-DOCUSATE SODIUM 8.6-50 MG PO TABS
2.0000 | ORAL_TABLET | ORAL | Status: DC
  Administered 2019-09-06 – 2019-09-08 (×2): 2 via ORAL
  Filled 2019-09-06 (×2): qty 2

## 2019-09-06 MED ORDER — LACTATED RINGERS IV SOLN: 500.0000 mL | Freq: Once | INTRAVENOUS | Status: DC

## 2019-09-06 MED ORDER — TETANUS-DIPHTH-ACELL PERTUSSIS 5-2.5-18.5 LF-MCG/0.5 IM SUSP: 0.5000 mL | Freq: Once | INTRAMUSCULAR | Status: DC

## 2019-09-06 MED ORDER — ZOLPIDEM TARTRATE 5 MG PO TABS: 5.0000 mg | ORAL_TABLET | Freq: Every evening | ORAL | Status: DC | PRN

## 2019-09-06 MED ORDER — DIPHENHYDRAMINE HCL 25 MG PO CAPS: 25.0000 mg | ORAL_CAPSULE | Freq: Four times a day (QID) | ORAL | Status: DC | PRN

## 2019-09-06 MED ORDER — ONDANSETRON HCL 4 MG/2ML IJ SOLN: 4.0000 mg | INTRAMUSCULAR | Status: DC | PRN

## 2019-09-06 MED ORDER — OXYCODONE HCL 5 MG PO TABS
10.0000 mg | ORAL_TABLET | ORAL | Status: DC | PRN
  Administered 2019-09-06 – 2019-09-08 (×11): 10 mg via ORAL
  Filled 2019-09-06 (×12): qty 2

## 2019-09-06 MED ORDER — OXYTOCIN 40 UNITS IN NORMAL SALINE INFUSION - SIMPLE MED
1.0000 m[IU]/min | INTRAVENOUS | Status: DC
  Administered 2019-09-06: 2 m[IU]/min via INTRAVENOUS

## 2019-09-06 MED ORDER — ONDANSETRON HCL 4 MG PO TABS: 4.0000 mg | ORAL_TABLET | ORAL | Status: DC | PRN

## 2019-09-06 NOTE — Anesthesia Procedure Notes (Signed)
Epidural Patient location during procedure: OB Start time: 09/06/2019 5:05 AM End time: 09/06/2019 5:20 AM  Staffing Anesthesiologist: Achille Rich, MD Performed: anesthesiologist   Preanesthetic Checklist Completed: patient identified, IV checked, site marked, risks and benefits discussed, monitors and equipment checked, pre-op evaluation and timeout performed  Epidural Patient position: sitting Prep: DuraPrep Patient monitoring: heart rate, cardiac monitor, continuous pulse ox and blood pressure Approach: midline Location: L2-L3 Injection technique: LOR saline  Needle:  Needle type: Tuohy  Needle gauge: 17 G Needle length: 9 cm Needle insertion depth: 5 cm Catheter type: closed end flexible Catheter size: 19 Gauge Catheter at skin depth: 12 cm Test dose: negative and Other  Assessment Events: blood not aspirated, injection not painful, no injection resistance and negative IV test  Additional Notes Informed consent obtained prior to proceeding including risk of failure, 1% risk of PDPH, risk of minor discomfort and bruising.  Discussed rare but serious complications including epidural abscess, permanent nerve injury, epidural hematoma.  Discussed alternatives to epidural analgesia and patient desires to proceed.  Timeout performed pre-procedure verifying patient name, procedure, and platelet count.  Patient tolerated procedure well. Reason for block:procedure for pain

## 2019-09-06 NOTE — Lactation Note (Signed)
This note was copied from a baby's chart. Lactation Consultation Note  Patient Name: Kelsey Lynch [id number]'[id number] Date: 09/06/2019   Mom is a G2.  Mom reports her first baby is 34 years old and she had a horrible time with breastfeeding. Mom reports she has decided to formula feed with this one.  Discussed pumping while baby in NICU.  Mom reports she does not want to because of all of her pain medication.  Urged lots of STS when infant able even if not breastfeeding,  Urged mom to call lactation if changes mind.  Maternal Data    Feeding Feeding Type: Formula Nipple Type: Slow - flow  LATCH Score                   Interventions    Lactation Tools Discussed/Used     Consult Status      Kelsey Lynch [name] 09/06/2019, 7:33 PM

## 2019-09-06 NOTE — H&P (Signed)
Kelsey Lynch is a 34 y.o. female presenting for IOL for deceleration on NST  33yo G2P1001 @ 40+1 presents for IOL for deceleration on NST in office for PD and h/o chronic narcotic use in pregnancy.   THe patiient has been taking oxycodone 5mg  Q6 hours during the pregnancy for chronic pain related to a back injury. SHe has been getting NSTs in the office for monitoring for this.  EIF on routine anatomy , NIPT low risk female  H/O marijuana use OB History    Gravida  2   Para  1   Term  1   Preterm      AB      Living  1     SAB      TAB      Ectopic      Multiple  0   Live Births  1          Past Medical History:  Diagnosis Date  . Back pain, chronic   . Depression   . Headache(784.0)    Migraines  . Hemoglobin Constant Spring trait   . Hemoglobin Constant Spring trait   . 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 Infection    UTI  . Neck pain   . Opioid dependence in remission (HCC)   . STD (sexually transmitted disease)    herpes  . Vaginal Pap smear, abnormal    bx and trx   Past Surgical History:  Procedure Laterality Date  . NO PAST SURGERIES     Family History: family history includes Diabetes in her paternal grandmother; Hypertension in her paternal grandmother. Social History:  reports that she has quit smoking. Her smoking use included cigarettes. She has never used smokeless tobacco. She reports previous alcohol use. She reports current drug use. Drug: Oxycodone.     Maternal Diabetes: No Genetic Screening: Normal Maternal Ultrasounds/Referrals: Isolated EIF (echogenic intracardiac focus) Fetal Ultrasounds or other Referrals:  Referred to Materal Fetal Medicine  Maternal Substance Abuse:  Yes:  Type: Marijuana, Prescription drugs Significant Maternal Medications:  Meds include: Other: Oxycodone 5mg  Q 6 Significant Maternal Lab Results:  Group B Strep positive Other Comments:  Mother Hgb Constant spring  Review of  Systems History Dilation: 10 Effacement (%): 90 Station: -1 Exam by:: k fields, rn Blood pressure 134/74, pulse (!) 109, temperature 99.5 F (37.5 C), temperature source Oral, resp. rate 20, height 5\' 2"  (1.575 m), weight 87.1 kg. Exam Physical Exam  Prenatal labs: ABO, Rh: --/--/B POS, B POS Performed at [facility], [address], [place], [facility] [address]  [phone]/07 2000) Antibody: NEG (05/07 2000) Rubella: Immune (10/27 0000) RPR: NON REACTIVE (05/07 1951)  HBsAg: Negative (10/27 0000)  HIV: Non-reactive (10/27 0000)  GBS: Positive/-- (04/13 0000)   Assessment/Plan: 1) Admit 2) Induce with misoprostal, augment with pitocin 3) Epidural on request 4) Vanc for GBS  02-18-1981 09/06/2019, 3:00 PM

## 2019-09-06 NOTE — Progress Notes (Signed)
Patient complains of increased pain in perineum. Dr. Tenny Craw notified. Ibuprofen ordered. Allergies reviewed with patient. Patient states that she takes motrin regularly and is fine with ibuprofen. Perineum is edematous. dermaplast spray, witch hazel pads and ice glove applied.

## 2019-09-06 NOTE — Progress Notes (Signed)
Patient complaining of chest pain. Patient moved up in bed and sitting in high fowlers. Pulse ox applied and reading 99%. Patient states pain relieved somewhat by position change.

## 2019-09-06 NOTE — Anesthesia Preprocedure Evaluation (Signed)
Anesthesia Evaluation  Patient identified by MRN, date of birth, ID band Patient awake    Reviewed: Allergy & Precautions, H&P , NPO status , Patient's Chart, lab work & pertinent test results  Airway Mallampati: II   Neck ROM: full    Dental   Pulmonary former smoker,    breath sounds clear to auscultation       Cardiovascular negative cardio ROS   Rhythm:regular Rate:Normal     Neuro/Psych  Headaches, PSYCHIATRIC DISORDERS Depression Back pain. Takes oxycodone    GI/Hepatic   Endo/Other    Renal/GU      Musculoskeletal   Abdominal   Peds  Hematology   Anesthesia Other Findings   Reproductive/Obstetrics (+) Pregnancy                             Anesthesia Physical Anesthesia Plan  ASA: II  Anesthesia Plan: Epidural   Post-op Pain Management:    Induction: Intravenous  PONV Risk Score and Plan: 2 and Treatment may vary due to age or medical condition  Airway Management Planned: Natural Airway  Additional Equipment:   Intra-op Plan:   Post-operative Plan:   Informed Consent: I have reviewed the patients History and Physical, chart, labs and discussed the procedure including the risks, benefits and alternatives for the proposed anesthesia with the patient or authorized representative who has indicated his/her understanding and acceptance.       Plan Discussed with: Anesthesiologist  Anesthesia Plan Comments:         Anesthesia Quick Evaluation

## 2019-09-07 LAB — CBC
HCT: 31.2 % — ABNORMAL LOW (ref 36.0–46.0)
Hemoglobin: 9.5 g/dL — ABNORMAL LOW (ref 12.0–15.0)
MCH: 22.4 pg — ABNORMAL LOW (ref 26.0–34.0)
MCHC: 30.4 g/dL (ref 30.0–36.0)
MCV: 73.6 fL — ABNORMAL LOW (ref 80.0–100.0)
Platelets: 253 10*3/uL (ref 150–400)
RBC: 4.24 MIL/uL (ref 3.87–5.11)
RDW: 17.3 % — ABNORMAL HIGH (ref 11.5–15.5)
WBC: 20.6 10*3/uL — ABNORMAL HIGH (ref 4.0–10.5)
nRBC: 0 % (ref 0.0–0.2)

## 2019-09-07 NOTE — Anesthesia Postprocedure Evaluation (Signed)
Anesthesia Post Note  Patient: Kelsey Lynch  Procedure(s) Performed: AN AD HOC LABOR EPIDURAL     Patient location during evaluation: Mother Baby Anesthesia Type: Epidural Level of consciousness: awake and alert Pain management: pain level controlled Vital Signs Assessment: post-procedure vital signs reviewed and stable Respiratory status: spontaneous breathing, nonlabored ventilation and respiratory function stable Cardiovascular status: stable Postop Assessment: no headache, no backache, epidural receding, no apparent nausea or vomiting, patient able to bend at knees, adequate PO intake and able to ambulate Anesthetic complications: no    Last Vitals:  Vitals:   09/07/19 0130 09/07/19 0530  BP: 138/76 132/69  Pulse: (!) 102 87  Resp: 16 16  Temp: 36.7 C 36.6 C  SpO2: 98% 98%    Last Pain:  Vitals:   09/07/19 0826  TempSrc:   PainSc: 7    Pain Goal:                   Laban Emperor

## 2019-09-07 NOTE — Progress Notes (Signed)
Post Partum Day 1 Subjective: up ad lib, voiding and tolerating PO c/o lower abdominal tenderness in suprapubic regiuon  Objective: Blood pressure 132/69, pulse 87, temperature 97.9 F (36.6 C), temperature source Oral, resp. rate 16, height 5\' 2"  (1.575 m), weight 87.1 kg, SpO2 98 %, unknown if currently breastfeeding.  Physical Exam:  General: alert, cooperative and appears stated age Lochia: appropriate Uterine Fundus: firm DVT Evaluation: No evidence of DVT seen on physical exam.  Recent Labs    09/05/19 1951 09/07/19 0540  HGB 11.0* 9.5*  HCT 36.5 31.2*    Assessment/Plan: Routine PP care No further fevers since initial after delivery Baby doing well after delivery, transferred out of NICU quickly back to nursery care   LOS: 2 days   11/07/19 09/07/2019, 1:03 PM

## 2019-09-08 MED ORDER — OXYCODONE HCL 5 MG PO TABS: 5.0000 mg | ORAL_TABLET | ORAL | 0 refills | Status: DC | PRN

## 2019-09-08 NOTE — Clinical Social Work Maternal (Signed)
CLINICAL SOCIAL WORK MATERNAL/CHILD NOTE  Patient Details  Name: Kelsey Lynch MRN: [id number] Date of Birth: 1986-01-22  Date:  09/08/2019  Clinical Social Worker Initiating Note:  Laurey Arrow Date/Time: Initiated:  09/08/19/1230     Child's Name:  Kelsey Lynch   Biological Parents:  Mother, Father   Need for Interpreter:  None   Reason for Referral:  Current Substance Use/Substance Use During Pregnancy    Address:  [place] [address]   MOB reports she also resides at [place] with FOB's mother Colan Neptune [phone]).   Phone number:  [phone] (home)     Additional phone number:   Household Members/Support Persons (HM/SP):   Household Member/Support Person 1, Household Member/Support Person 2   HM/SP Name Relationship DOB or Age  HM/SP -1 Erlene Quan Womack(MOB reported that FOB is currently in jail in [place]) FOB 11/28/1983  HM/SP -2 Ardelle Balls Womack son 07/17/2014  HM/SP -3        HM/SP -4        HM/SP -5        HM/SP -6        HM/SP -7        HM/SP -8          Natural Supports (not living in the home):  Extended Family, Immediate Family, Parent(MOB reported that FOB's family will also provide support when needed.)   Professional Supports: Therapist(MOB has a therapist that she is established with but is currently not seeing him.)   Employment: Unemployed   Type of Work:     Education:  Programmer, systems   Homebound arranged:    Museum/gallery curator Resources:  Medicaid   Other Resources:      Cultural/Religious Considerations Which May Impact Care:  None Reported  Strengths:  Pediatrician chosen   Psychotropic Medications:         Pediatrician:    [place]  Pediatrician List:   [place] Other([facility] [facility]  [place]      Pediatrician Fax Number:    Risk Factors/Current  Problems:  Substance Use , Mental Health Concerns    Cognitive State:  Alert , Able to Concentrate , Insightful , Linear Thinking    Mood/Affect:  Bright , Happy , Interested , Comfortable    CSW Assessment: CSW met with MOB in room 505 to complete an assessment for MH hx and SA hx.  When CSW arrived, MOB was resting in bed.  CSW explained CSW's role and invited to MOB to ask questions during the assessment.  MOB presented as polite and was easy to engage.   CSW asked about MOB's MH hx and MOB denied a hx of depression.  MOB reported that her PCP referred to counseling due to relationship problems with FOB.  MOB shared that the issues with FOB have been resolve and she feels "Good."  MOB also denied PMAD symptoms after her first baby and report feeling comfortable seeking help if needed. MOB reports having a good support team that consists of MOB's and FOB's immediate family members. CSW provided education regarding the baby blues period vs. perinatal mood disorders, discussed treatment and gave resources for mental health follow up if concerns arise.  CSW recommends self-evaluation during the postpartum time period and contacting MOB's therapist or medical professional if symptoms are noted at any time; MOB agreed. CSW  assessed for safety and MOB denied SI, HI, and DV.  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 Per MOB, MOB has not used any marijuana since she was "about 6 months pregnant." MOB communicated that she smoked to decrease her stress an to increase her ability to sleep. CSW reviewed hospital's substance exposed policy and MOB was understanding however appeared un bothered.  MOB communicated, "It's been so long ago since I smoked, I'm not concerned." MOB is aware that infant's UDS is negative and that CSW will continue to monitor infant's CDS and will make a report to [place] if warranted. MOB denied having any CPS hx.  MOB denied that use of all other illicit substances and declined SA resources.  MOB shared currently going to a pain management center and taking oxy daily for back pain.  CSW provided NAS education and MOB was understanding.   CSW also informed MOB of NICU visitation policy and MOB denied barriers to visiting with infant after MOB discharges. MOB is aware of resources that are available to her to have CSW to follow-up with her 2x weekly.   MOB reports being familiar with Sudden Infant Death Syndrome (SIDS) precautions.    Per MOB, MOB has all essential items to care for infant including a used car seat (not expired) and a bed for infant.   CSW will continue to offer resources and supports to family while infant remains in NICU.     CSW Plan/Description:  Psychosocial Support and Ongoing Assessment of Needs, Sudden Infant Death Syndrome (SIDS) Education, Perinatal Mood and Anxiety Disorder (PMADs) Education, Neonatal Abstinence Syndrome (NAS) Education, Other Patient/Family Education, [place], Other Information/Referral to [employer], CSW Will Continue to Monitor Umbilical Cord Tissue Drug Screen Results and Make Report if Warranted   Laurey Arrow, MSW, LCSW Clinical Social Work [phone]   Dimple Nanas, [place] 09/08/2019, 12:43 PM

## 2019-09-08 NOTE — Discharge Summary (Signed)
Obstetric Discharge Summary Reason for Admission: induction of labor and fetal heart rate decelerations Prenatal Procedures: ultrasound Intrapartum Procedures: spontaneous vaginal delivery Postpartum Procedures: none Complications-Operative and Postpartum: none Hemoglobin  Date Value Ref Range Status  09/07/2019 9.5 (L) 12.0 - 15.0 g/dL Final   HCT  Date Value Ref Range Status  09/07/2019 31.2 (L) 36.0 - 46.0 % Final    Physical Exam:  General: alert and cooperative Lochia: appropriate Uterine Fundus: firm DVT Evaluation: No evidence of DVT seen on physical exam.  Discharge Diagnoses: Term Pregnancy-delivered  Discharge Information: Date: 09/08/2019 Activity: pelvic rest Diet: routine Medications: PNV, Ibuprofen and oxycodone Condition: stable Instructions: refer to practice specific booklet Discharge to: home Follow-up Information    Waynard Reeds, MD Follow up in 4 week(s).   Specialty: Obstetrics and Gynecology Contact information: [address] [place] [address] [phone]           Newborn Data: Live born female  Birth Weight: 8 lb 0.2 oz (3635 g) APGAR: 2, 7  Newborn Delivery   Time head delivered: 09/06/2019 14:29:00 Birth date/time: 09/06/2019 14:30:00 Delivery type: Vaginal, Spontaneous      Baby remains in NICU at time of mom's discharge  Philip Aspen 09/08/2019, 10:16 AM

## 2019-09-09 ENCOUNTER — Other Ambulatory Visit (HOSPITAL_COMMUNITY): Payer: Medicaid Other

## 2019-09-09 LAB — SURGICAL PATHOLOGY

## 2019-09-11 ENCOUNTER — Inpatient Hospital Stay (HOSPITAL_COMMUNITY)
Admission: AD | Admit: 2019-09-11 | Payer: Medicaid Other | Source: Home / Self Care | Admitting: Obstetrics and Gynecology

## 2019-09-11 ENCOUNTER — Inpatient Hospital Stay (HOSPITAL_COMMUNITY): Payer: Medicaid Other

## 2019-11-25 ENCOUNTER — Telehealth: Payer: Self-pay | Admitting: *Deleted

## 2019-11-25 ENCOUNTER — Encounter: Payer: Self-pay | Admitting: Neurology

## 2019-11-25 ENCOUNTER — Institutional Professional Consult (permissible substitution): Payer: Medicaid Other | Admitting: Neurology

## 2019-11-25 NOTE — Telephone Encounter (Signed)
No showed new patient appointment. 

## 2020-08-03 DIAGNOSIS — K219 Gastro-esophageal reflux disease without esophagitis: Secondary | ICD-10-CM | POA: Insufficient documentation

## 2021-09-20 DIAGNOSIS — F411 Generalized anxiety disorder: Secondary | ICD-10-CM | POA: Insufficient documentation

## 2021-09-20 DIAGNOSIS — D649 Anemia, unspecified: Secondary | ICD-10-CM | POA: Insufficient documentation

## 2021-10-24 ENCOUNTER — Inpatient Hospital Stay (HOSPITAL_COMMUNITY)
Admission: EM | Admit: 2021-10-24 | Discharge: 2021-10-27 | DRG: 811 | Disposition: A | Discharge status: Home / Self Care | Payer: Medicaid Other | Attending: Internal Medicine | Admitting: Internal Medicine

## 2021-10-24 ENCOUNTER — Other Ambulatory Visit: Payer: Self-pay

## 2021-10-24 ENCOUNTER — Encounter (HOSPITAL_COMMUNITY): Payer: Self-pay

## 2021-10-24 DIAGNOSIS — K254 Chronic or unspecified gastric ulcer with hemorrhage: Secondary | ICD-10-CM | POA: Diagnosis present

## 2021-10-24 DIAGNOSIS — E669 Obesity, unspecified: Secondary | ICD-10-CM | POA: Diagnosis present

## 2021-10-24 DIAGNOSIS — M545 Low back pain, unspecified: Secondary | ICD-10-CM | POA: Diagnosis present

## 2021-10-24 DIAGNOSIS — R7982 Elevated C-reactive protein (CRP): Secondary | ICD-10-CM | POA: Diagnosis present

## 2021-10-24 DIAGNOSIS — G894 Chronic pain syndrome: Secondary | ICD-10-CM | POA: Diagnosis present

## 2021-10-24 DIAGNOSIS — Z6832 Body mass index (BMI) 32.0-32.9, adult: Secondary | ICD-10-CM

## 2021-10-24 DIAGNOSIS — Z79899 Other long term (current) drug therapy: Secondary | ICD-10-CM

## 2021-10-24 DIAGNOSIS — F419 Anxiety disorder, unspecified: Secondary | ICD-10-CM | POA: Diagnosis present

## 2021-10-24 DIAGNOSIS — K219 Gastro-esophageal reflux disease without esophagitis: Secondary | ICD-10-CM | POA: Diagnosis present

## 2021-10-24 DIAGNOSIS — M310 Hypersensitivity angiitis: Secondary | ICD-10-CM | POA: Diagnosis present

## 2021-10-24 DIAGNOSIS — Z888 Allergy status to other drugs, medicaments and biological substances status: Secondary | ICD-10-CM

## 2021-10-24 DIAGNOSIS — R21 Rash and other nonspecific skin eruption: Secondary | ICD-10-CM | POA: Diagnosis present

## 2021-10-24 DIAGNOSIS — Z886 Allergy status to analgesic agent status: Secondary | ICD-10-CM

## 2021-10-24 DIAGNOSIS — D509 Iron deficiency anemia, unspecified: Principal | ICD-10-CM | POA: Diagnosis present

## 2021-10-24 DIAGNOSIS — Z880 Allergy status to penicillin: Secondary | ICD-10-CM

## 2021-10-24 DIAGNOSIS — Z91013 Allergy to seafood: Secondary | ICD-10-CM

## 2021-10-24 DIAGNOSIS — K633 Ulcer of intestine: Secondary | ICD-10-CM

## 2021-10-24 DIAGNOSIS — R600 Localized edema: Secondary | ICD-10-CM | POA: Diagnosis present

## 2021-10-24 DIAGNOSIS — G629 Polyneuropathy, unspecified: Secondary | ICD-10-CM | POA: Diagnosis present

## 2021-10-24 DIAGNOSIS — D649 Anemia, unspecified: Secondary | ICD-10-CM | POA: Diagnosis present

## 2021-10-24 DIAGNOSIS — R195 Other fecal abnormalities: Secondary | ICD-10-CM

## 2021-10-24 DIAGNOSIS — Z87891 Personal history of nicotine dependence: Secondary | ICD-10-CM

## 2021-10-24 HISTORY — DX: Anemia, unspecified: D64.9

## 2021-10-24 LAB — COMPREHENSIVE METABOLIC PANEL
ALT: 13 U/L (ref 0–44)
AST: 12 U/L — ABNORMAL LOW (ref 15–41)
Albumin: 2.9 g/dL — ABNORMAL LOW (ref 3.5–5.0)
Alkaline Phosphatase: 74 U/L (ref 38–126)
Anion gap: 7 (ref 5–15)
BUN: 15 mg/dL (ref 6–20)
CO2: 21 mmol/L — ABNORMAL LOW (ref 22–32)
Calcium: 8.1 mg/dL — ABNORMAL LOW (ref 8.9–10.3)
Chloride: 107 mmol/L (ref 98–111)
Creatinine, Ser: 0.89 mg/dL (ref 0.44–1.00)
GFR, Estimated: >60 mL/min (ref >60)
Glucose, Bld: 115 mg/dL — ABNORMAL HIGH (ref 70–99)
Potassium: 4.1 mmol/L (ref 3.5–5.1)
Sodium: 135 mmol/L (ref 135–145)
Total Bilirubin: 0.4 mg/dL (ref 0.3–1.2)
Total Protein: 6 g/dL — ABNORMAL LOW (ref 6.5–8.1)

## 2021-10-24 LAB — CBC WITH DIFFERENTIAL/PLATELET
Abs Immature Granulocytes: 0.03 10*3/uL (ref 0.00–0.07)
Basophils Absolute: 0 10*3/uL (ref 0.0–0.1)
Basophils Relative: 1 %
Eosinophils Absolute: 0.3 10*3/uL (ref 0.0–0.5)
Eosinophils Relative: 3 %
HCT: 22 % — ABNORMAL LOW (ref 36.0–46.0)
Hemoglobin: 5.9 g/dL — CL (ref 12.0–15.0)
Immature Granulocytes: 0 %
Lymphocytes Relative: 21 %
Lymphs Abs: 1.7 10*3/uL (ref 0.7–4.0)
MCH: 15.5 pg — ABNORMAL LOW (ref 26.0–34.0)
MCHC: 26.8 g/dL — ABNORMAL LOW (ref 30.0–36.0)
MCV: 57.9 fL — ABNORMAL LOW (ref 80.0–100.0)
Monocytes Absolute: 0.5 10*3/uL (ref 0.1–1.0)
Monocytes Relative: 6 %
Neutro Abs: 5.7 10*3/uL (ref 1.7–7.7)
Neutrophils Relative %: 69 %
Platelets: 341 10*3/uL (ref 150–400)
RBC: 3.8 MIL/uL — ABNORMAL LOW (ref 3.87–5.11)
RDW: 23.8 % — ABNORMAL HIGH (ref 11.5–15.5)
Smear Review: NORMAL
WBC: 8.3 10*3/uL (ref 4.0–10.5)
nRBC: 0.4 % — ABNORMAL HIGH (ref 0.0–0.2)

## 2021-10-24 LAB — I-STAT BETA HCG BLOOD, ED (MC, WL, AP ONLY): I-stat hCG, quantitative: <5 m[IU]/mL (ref <5)

## 2021-10-24 LAB — PREPARE RBC (CROSSMATCH)

## 2021-10-24 MED ORDER — PROCHLORPERAZINE EDISYLATE 10 MG/2ML IJ SOLN
10.0000 mg | Freq: Once | INTRAMUSCULAR | Status: AC
  Administered 2021-10-24: 10 mg via INTRAVENOUS
  Filled 2021-10-24: qty 2

## 2021-10-24 MED ORDER — SODIUM CHLORIDE 0.9 % IV BOLUS: 500.0000 mL | Freq: Once | INTRAVENOUS | Status: DC

## 2021-10-24 MED ORDER — DIPHENHYDRAMINE HCL 50 MG/ML IJ SOLN
12.5000 mg | Freq: Once | INTRAMUSCULAR | Status: AC
  Administered 2021-10-24: 12.5 mg via INTRAVENOUS
  Filled 2021-10-24: qty 1

## 2021-10-24 MED ORDER — PANTOPRAZOLE SODIUM 40 MG IV SOLR
40.0000 mg | Freq: Once | INTRAVENOUS | Status: AC
  Administered 2021-10-24: 40 mg via INTRAVENOUS
  Filled 2021-10-24: qty 10

## 2021-10-24 MED ORDER — SODIUM CHLORIDE 0.9 % IV SOLN: 10.0000 mL/h | Freq: Once | INTRAVENOUS | Status: DC

## 2021-10-24 NOTE — ED Notes (Signed)
Patient ambulated to restroom.

## 2021-10-24 NOTE — ED Provider Notes (Signed)
[facility] EMERGENCY DEPARTMENT Provider Note   CSN: [id number] Arrival date & time: 10/24/21  1918     History  Chief Complaint  Patient presents with   low hemoglobin    Kelsey Lynch is a 36 y.o. female with a hx of anemia, GERD, migraines, and depression who presents to the ED with complaints of anemia. Patient reports she went to see her pain management doctor in May and her hgb was 7.5, subsequently saw her PCP- started on ferrous sulfate BID, rechecked & it was 6.8 therefore increased to TID, plans for urgent GI referral in place with appointment tomorrow, however when rechecked today hgb 5.8 therefore was told to come to the ED. Patient reports she has felt fatigued, generally weak, and has had intermittent dizziness/lightheadedness. Feels short of breath with exertion. No other alleviating/aggravating factors. She does not have a period on Depo-Provera, no vaginal bleeding. Her stool has looked normal to her but she was told that her stool sample showed blood outpatient. She developed a painful rash to her extremities over the weekend, swelling in her legs since yesterday, and a generalized headache. Denies fever, chills, recent syncope (did have a syncopal event in March), chest pain, abdominal pain, melena, hematochezia, or BRBPR. No recent tick exposures. No recent STI exposures. She was taking NSAIDs until she saw her PCP and has since discontinued, does not take blood thinners.   HPI     Home Medications Prior to Admission medications   Medication Sig Start Date End Date Taking? Authorizing Provider  buprenorphine (BUTRANS) 20 MCG/HR PTWK Place 1 patch onto the skin once a week. 09/08/21  Yes [provider]  busPIRone (BUSPAR) 15 MG tablet Take 15 mg by mouth 2 (two) times daily. 09/20/21  Yes [provider]  ferrous sulfate 324 (65 Fe) MG TBEC Take 1 tablet by mouth in the morning, at noon, and at bedtime. 09/22/21  Yes [provider]  gabapentin (NEURONTIN) 600 MG tablet Take 600 mg by mouth in the morning, at noon, in the evening, and at bedtime. 10/07/21  Yes [provider]  medroxyPROGESTERone (DEPO-PROVERA) 150 MG/ML injection Inject 150 mg into the muscle every 3 (three) months. 10/29/19  Yes [provider]  oxyCODONE (ROXICODONE) 15 MG immediate release tablet Take 15 mg by mouth every 6 (six) hours as needed. 10/11/21  Yes [provider]  tiZANidine (ZANAFLEX) 4 MG tablet Take 4 mg by mouth in the morning, at noon, in the evening, and at bedtime. 02/12/20  Yes [provider]  valACYclovir (VALTREX) 500 MG tablet Take 500 mg by mouth daily. 09/20/21  Yes [provider]  Vitamin D, Ergocalciferol, (DRISDOL) 1.25 MG (50000 UNIT) CAPS capsule Take 50,000 Units by mouth once a week. 09/07/21  Yes [provider]  famotidine (PEPCID) 40 MG tablet Take by mouth. 10/10/21   [provider]  naloxone [facility]) nasal spray 4 mg/0.1 mL Place 1 spray into the nose once. 10/20/20   [provider]  oxyCODONE (OXY IR/ROXICODONE) 5 MG immediate release tablet Take 1 tablet (5 mg total) by mouth every 4 (four) hours as needed (pain scale 4-7). Patient not taking: Reported on 10/24/2021 09/08/19   Philip Aspen, DO      Allergies    Penicillins, Shellfish allergy, Hydroxyzine, and Meloxicam    Review of Systems   Review of Systems  Constitutional:  Positive for fatigue. Negative for fever.  HENT:  Negative for congestion and sore throat.  Eyes:  Negative for visual disturbance.  Respiratory:  Positive for shortness of breath.   Cardiovascular:  Positive for leg swelling. Negative for chest pain.  Gastrointestinal:  Negative for abdominal pain, anal bleeding, blood in stool, nausea and vomiting.  Genitourinary:  Negative for vaginal bleeding.  Skin:  Positive for rash.  Neurological:  Positive for dizziness, weakness and light-headedness. Negative  for syncope.  All other systems reviewed and are negative.   Physical Exam Updated Vital Signs BP (!) 122/57 (BP Location: Right Arm)   Pulse 81   Temp 98.5 F (36.9 C) (Oral)   Resp 19   Ht 5\' 2"  (1.575 m)   Wt 81.2 kg   SpO2 100%   BMI 32.74 kg/m  Physical Exam Vitals and nursing note reviewed.  Constitutional:      General: She is not in acute distress.    Appearance: Normal appearance. She is not toxic-appearing.  HENT:     Head: Normocephalic and atraumatic.     Mouth/Throat:     Pharynx: Oropharynx is clear. Uvula midline.  Eyes:     General: Vision grossly intact. Gaze aligned appropriately.     Extraocular Movements: Extraocular movements intact.     Conjunctiva/sclera: Conjunctivae normal.     Pupils: Pupils are equal, round, and reactive to light.     Comments: No proptosis.   Cardiovascular:     Rate and Rhythm: Normal rate and regular rhythm.  Pulmonary:     Effort: Pulmonary effort is normal.     Breath sounds: Normal breath sounds.  Abdominal:     General: There is no distension.     Palpations: Abdomen is soft.     Tenderness: There is no abdominal tenderness. There is no guarding or rebound.  Genitourinary:    Comments: Dre performed w/ RN as chaperone, no significant stool present in rectum, no bright red blood or melena.  Musculoskeletal:        General: Tenderness (bilateral lower legs) present.     Cervical back: Normal range of motion and neck supple. No rigidity.     Right lower leg: Edema present.     Left lower leg: Edema present.  Skin:    General: Skin is warm and dry.     Findings: Rash (erythematous, blanches, TTP, more so to distal extremities.) present.  Neurological:     Mental Status: She is alert.     Comments: Alert. Clear speech. No facial droop. CNIII-XII grossly intact. Bilateral upper and lower extremities' sensation grossly intact. 5/5 symmetric strength with grip strength and with plantar and dorsi flexion bilaterally .  Normal finger to nose bilaterally. Negative pronator drift. Gait intact.    Psychiatric:        Mood and Affect: Mood normal.        Behavior: Behavior normal.        ED Results / Procedures / Treatments   Labs (all labs ordered are listed, but only abnormal results are displayed) Labs Reviewed  COMPREHENSIVE METABOLIC PANEL - Abnormal; Notable for the following components:      Result Value   CO2 21 (*)    Glucose, Bld 115 (*)    Calcium 8.1 (*)    Total Protein 6.0 (*)    Albumin 2.9 (*)    AST 12 (*)    All other components within normal limits  CBC WITH DIFFERENTIAL/PLATELET - Abnormal; Notable for the following components:   RBC 3.80 (*)    Hemoglobin 5.9 (*)  HCT 22.0 (*)    MCV 57.9 (*)    MCH 15.5 (*)    MCHC 26.8 (*)    RDW 23.8 (*)    nRBC 0.4 (*)    All other components within normal limits  PATHOLOGIST SMEAR REVIEW  I-STAT BETA HCG BLOOD, ED ([facility], [facility], [facility] ONLY)  POC OCCULT BLOOD, ED  TYPE AND SCREEN    EKG None  Radiology No results found.  Procedures .Critical Care  Performed by: Cherly Anderson, PA-C Authorized by: Cherly Anderson, PA-C     CRITICAL CARE Performed by: Harvie Heck   Total critical care time: 30 minutes  Critical care time was exclusive of separately billable procedures and treating other patients.  Critical care was necessary to treat or prevent imminent or life-threatening deterioration.  Critical care was time spent personally by me on the following activities: development of treatment plan with patient and/or surrogate as well as nursing, discussions with consultants, evaluation of patient's response to treatment, examination of patient, obtaining history from patient or surrogate, ordering and performing treatments and interventions, ordering and review of laboratory studies, ordering and review of radiographic studies, pulse oximetry and re-evaluation of patient's condition.    Medications  Ordered in ED Medications - No data to display  ED Course/ Medical Decision Making/ A&P                           Medical Decision Making Amount and/or Complexity of Data Reviewed Labs: ordered.  Risk Prescription drug management. Decision regarding hospitalization.   Patient presents to the ED with complaints of anemia, this involves an extensive number of treatment options, and is a complaint that carries with it a high risk of complications and morbidity. Nontoxic, vitals without significant past medical hx.  Also has rash to the extremities, headache, and lower extremity swelling.   Additional history obtained:  External records including PCP notes/labs viewed.   Lab Tests:  I viewed & interpreted labs including:  CBC: anemia w/ hgb 5.9 and hct 22.0, microcytic hypochromic.  CMP: hypoalbuminemia, renal function & LFTs WNL Preg test: Negative  ED Course:  Patient presenting w/ symptomatic anemia, hgb/hct down trending in the outpatient setting despite oral iron, will check anemia panel and transfuse 2 units PRBC. Migraine cocktail ordered for headache. Rash of unclear etiology, no palm/sole/mucous membrane involvement, considering vasculitis. Will discuss w/ medicine for admission.   00:12: CONSULT: I discussed with hospitalist Dr. Adela Glimpse, except admission, requesting hematology consult  00:20: CONSULT: Discussed with hematologist Dr. Myna Hidalgo- recommends hemoglobin electropheresis and LDH in addition to labs drawn, okay to transfuse with PRBC, recommends iron transfusion and to give 2 mg of folic acid daily. Recommendations were relayed to hospitalist.   Findings and plan of care discussed with supervising physician Dr. Jeraldine Loots who has evaluated the patient & is in agreement.    Portions of this note were generated with Scientist, clinical (histocompatibility and immunogenetics). Dictation errors may occur despite best attempts at proofreading.  Final Clinical Impression(s) / ED Diagnoses Final diagnoses:   Symptomatic anemia    Rx / DC Orders ED Discharge Orders     None         Cherly Anderson, PA-C 10/25/21 0701    Gerhard Munch, MD 10/26/21 [phone]

## 2021-10-25 ENCOUNTER — Inpatient Hospital Stay (HOSPITAL_COMMUNITY): Payer: Medicaid Other

## 2021-10-25 ENCOUNTER — Encounter (HOSPITAL_COMMUNITY): Payer: Self-pay | Admitting: Internal Medicine

## 2021-10-25 ENCOUNTER — Ambulatory Visit: Payer: Medicaid Other | Admitting: Physician Assistant

## 2021-10-25 DIAGNOSIS — D649 Anemia, unspecified: Secondary | ICD-10-CM | POA: Diagnosis present

## 2021-10-25 DIAGNOSIS — R600 Localized edema: Secondary | ICD-10-CM | POA: Diagnosis present

## 2021-10-25 DIAGNOSIS — K219 Gastro-esophageal reflux disease without esophagitis: Secondary | ICD-10-CM

## 2021-10-25 DIAGNOSIS — G8929 Other chronic pain: Secondary | ICD-10-CM | POA: Diagnosis not present

## 2021-10-25 DIAGNOSIS — K311 Adult hypertrophic pyloric stenosis: Secondary | ICD-10-CM | POA: Diagnosis not present

## 2021-10-25 DIAGNOSIS — F419 Anxiety disorder, unspecified: Secondary | ICD-10-CM | POA: Diagnosis present

## 2021-10-25 DIAGNOSIS — G629 Polyneuropathy, unspecified: Secondary | ICD-10-CM | POA: Diagnosis present

## 2021-10-25 DIAGNOSIS — D500 Iron deficiency anemia secondary to blood loss (chronic): Secondary | ICD-10-CM

## 2021-10-25 DIAGNOSIS — Z888 Allergy status to other drugs, medicaments and biological substances status: Secondary | ICD-10-CM | POA: Diagnosis not present

## 2021-10-25 DIAGNOSIS — G894 Chronic pain syndrome: Secondary | ICD-10-CM | POA: Diagnosis present

## 2021-10-25 DIAGNOSIS — Z6832 Body mass index (BMI) 32.0-32.9, adult: Secondary | ICD-10-CM | POA: Diagnosis not present

## 2021-10-25 DIAGNOSIS — R609 Edema, unspecified: Secondary | ICD-10-CM

## 2021-10-25 DIAGNOSIS — M545 Low back pain, unspecified: Secondary | ICD-10-CM | POA: Diagnosis present

## 2021-10-25 DIAGNOSIS — E669 Obesity, unspecified: Secondary | ICD-10-CM | POA: Diagnosis present

## 2021-10-25 DIAGNOSIS — R21 Rash and other nonspecific skin eruption: Secondary | ICD-10-CM

## 2021-10-25 DIAGNOSIS — D509 Iron deficiency anemia, unspecified: Secondary | ICD-10-CM | POA: Diagnosis present

## 2021-10-25 DIAGNOSIS — L52 Erythema nodosum: Secondary | ICD-10-CM | POA: Diagnosis not present

## 2021-10-25 DIAGNOSIS — M310 Hypersensitivity angiitis: Secondary | ICD-10-CM | POA: Diagnosis present

## 2021-10-25 DIAGNOSIS — Z91013 Allergy to seafood: Secondary | ICD-10-CM | POA: Diagnosis not present

## 2021-10-25 DIAGNOSIS — Z880 Allergy status to penicillin: Secondary | ICD-10-CM | POA: Diagnosis not present

## 2021-10-25 DIAGNOSIS — R7982 Elevated C-reactive protein (CRP): Secondary | ICD-10-CM | POA: Diagnosis present

## 2021-10-25 DIAGNOSIS — Z79899 Other long term (current) drug therapy: Secondary | ICD-10-CM | POA: Diagnosis not present

## 2021-10-25 DIAGNOSIS — Z87891 Personal history of nicotine dependence: Secondary | ICD-10-CM | POA: Diagnosis not present

## 2021-10-25 DIAGNOSIS — K259 Gastric ulcer, unspecified as acute or chronic, without hemorrhage or perforation: Secondary | ICD-10-CM | POA: Diagnosis not present

## 2021-10-25 DIAGNOSIS — K254 Chronic or unspecified gastric ulcer with hemorrhage: Secondary | ICD-10-CM | POA: Diagnosis present

## 2021-10-25 DIAGNOSIS — K633 Ulcer of intestine: Secondary | ICD-10-CM | POA: Diagnosis not present

## 2021-10-25 DIAGNOSIS — Z886 Allergy status to analgesic agent status: Secondary | ICD-10-CM | POA: Diagnosis not present

## 2021-10-25 DIAGNOSIS — F418 Other specified anxiety disorders: Secondary | ICD-10-CM | POA: Diagnosis not present

## 2021-10-25 DIAGNOSIS — R195 Other fecal abnormalities: Secondary | ICD-10-CM | POA: Diagnosis not present

## 2021-10-25 LAB — COMPREHENSIVE METABOLIC PANEL
ALT: 13 U/L (ref 0–44)
AST: 11 U/L — ABNORMAL LOW (ref 15–41)
Albumin: 2.9 g/dL — ABNORMAL LOW (ref 3.5–5.0)
Alkaline Phosphatase: 74 U/L (ref 38–126)
Anion gap: 9 (ref 5–15)
BUN: 12 mg/dL (ref 6–20)
CO2: 22 mmol/L (ref 22–32)
Calcium: 8.5 mg/dL — ABNORMAL LOW (ref 8.9–10.3)
Chloride: 109 mmol/L (ref 98–111)
Creatinine, Ser: 0.99 mg/dL (ref 0.44–1.00)
GFR, Estimated: >60 mL/min (ref >60)
Glucose, Bld: 113 mg/dL — ABNORMAL HIGH (ref 70–99)
Potassium: 4 mmol/L (ref 3.5–5.1)
Sodium: 140 mmol/L (ref 135–145)
Total Bilirubin: 0.9 mg/dL (ref 0.3–1.2)
Total Protein: 6 g/dL — ABNORMAL LOW (ref 6.5–8.1)

## 2021-10-25 LAB — VITAMIN B12: Vitamin B-12: 341 pg/mL (ref 180–914)

## 2021-10-25 LAB — SEDIMENTATION RATE: Sed Rate: 34 mm/hr — ABNORMAL HIGH (ref 0–22)

## 2021-10-25 LAB — PROTIME-INR
INR: 1.1 (ref 0.8–1.2)
Prothrombin Time: 14.6 seconds (ref 11.4–15.2)

## 2021-10-25 LAB — URINALYSIS, COMPLETE (UACMP) WITH MICROSCOPIC
Bilirubin Urine: NEGATIVE
Glucose, UA: NEGATIVE mg/dL
Hgb urine dipstick: NEGATIVE
Ketones, ur: NEGATIVE mg/dL
Nitrite: NEGATIVE
Protein, ur: NEGATIVE mg/dL
Specific Gravity, Urine: 1.009 (ref 1.005–1.030)
pH: 7 (ref 5.0–8.0)

## 2021-10-25 LAB — IRON AND TIBC
Iron: 17 ug/dL — ABNORMAL LOW (ref 28–170)
Saturation Ratios: 4 % — ABNORMAL LOW (ref 10.4–31.8)
TIBC: 407 ug/dL (ref 250–450)
UIBC: 390 ug/dL

## 2021-10-25 LAB — HEPATITIS B CORE ANTIBODY, TOTAL: Hep B Core Total Ab: NONREACTIVE

## 2021-10-25 LAB — RETICULOCYTES: RBC.: 4.1 MIL/uL (ref 3.87–5.11)

## 2021-10-25 LAB — HEPATITIS B SURFACE ANTIBODY,QUALITATIVE: Hep B S Ab: NONREACTIVE

## 2021-10-25 LAB — CBC
HCT: 29.5 % — ABNORMAL LOW (ref 36.0–46.0)
Hemoglobin: 8.8 g/dL — ABNORMAL LOW (ref 12.0–15.0)
MCH: 18.8 pg — ABNORMAL LOW (ref 26.0–34.0)
MCHC: 29.8 g/dL — ABNORMAL LOW (ref 30.0–36.0)
MCV: 62.9 fL — ABNORMAL LOW (ref 80.0–100.0)
Platelets: ADEQUATE 10*3/uL (ref 150–400)
RBC: 4.69 MIL/uL (ref 3.87–5.11)
RDW: 29.2 % — ABNORMAL HIGH (ref 11.5–15.5)
WBC: 6.6 10*3/uL (ref 4.0–10.5)
nRBC: 1.4 % — ABNORMAL HIGH (ref 0.0–0.2)

## 2021-10-25 LAB — FERRITIN: Ferritin: 15 ng/mL (ref 11–307)

## 2021-10-25 LAB — MAGNESIUM: Magnesium: 2.3 mg/dL (ref 1.7–2.4)

## 2021-10-25 LAB — HEPATITIS B SURFACE ANTIGEN: Hepatitis B Surface Ag: NONREACTIVE

## 2021-10-25 LAB — HIV ANTIBODY (ROUTINE TESTING W REFLEX): HIV Screen 4th Generation wRfx: NONREACTIVE

## 2021-10-25 LAB — HEPATITIS C ANTIBODY: HCV Ab: NONREACTIVE

## 2021-10-25 LAB — LACTATE DEHYDROGENASE: LDH: 162 U/L (ref 98–192)

## 2021-10-25 LAB — CK: Total CK: 55 U/L (ref 38–234)

## 2021-10-25 LAB — TSH: TSH: 1.302 u[IU]/mL (ref 0.350–4.500)

## 2021-10-25 LAB — PREALBUMIN: Prealbumin: 9.4 mg/dL — ABNORMAL LOW (ref 18–38)

## 2021-10-25 LAB — PHOSPHORUS: Phosphorus: 3.6 mg/dL (ref 2.5–4.6)

## 2021-10-25 LAB — SAVE SMEAR(SSMR), FOR PROVIDER SLIDE REVIEW

## 2021-10-25 LAB — PATHOLOGIST SMEAR REVIEW

## 2021-10-25 LAB — D-DIMER, QUANTITATIVE: D-Dimer, Quant: 0.84 ug/mL-FEU — ABNORMAL HIGH (ref 0.00–0.50)

## 2021-10-25 LAB — FOLATE: Folate: 6 ng/mL (ref >5.9)

## 2021-10-25 LAB — LACTIC ACID, PLASMA: Lactic Acid, Venous: 0.9 mmol/L (ref 0.5–1.9)

## 2021-10-25 LAB — APTT: aPTT: 36 seconds (ref 24–36)

## 2021-10-25 LAB — C-REACTIVE PROTEIN: CRP: 8.6 mg/dL — ABNORMAL HIGH (ref <1.0)

## 2021-10-25 MED ORDER — METOCLOPRAMIDE HCL 5 MG/ML IJ SOLN
10.0000 mg | Freq: Four times a day (QID) | INTRAMUSCULAR | Status: AC
  Administered 2021-10-25 (×2): 10 mg via INTRAVENOUS
  Filled 2021-10-25 (×2): qty 2

## 2021-10-25 MED ORDER — BISACODYL 5 MG PO TBEC
20.0000 mg | DELAYED_RELEASE_TABLET | Freq: Once | ORAL | Status: AC
  Administered 2021-10-25: 20 mg via ORAL
  Filled 2021-10-25: qty 4

## 2021-10-25 MED ORDER — PANTOPRAZOLE SODIUM 40 MG PO TBEC
40.0000 mg | DELAYED_RELEASE_TABLET | Freq: Every day | ORAL | Status: DC
  Administered 2021-10-25 – 2021-10-26 (×2): 40 mg via ORAL
  Filled 2021-10-25 (×2): qty 1

## 2021-10-25 MED ORDER — HYDROCODONE-ACETAMINOPHEN 5-325 MG PO TABS
1.0000 | ORAL_TABLET | ORAL | Status: DC | PRN
  Administered 2021-10-25 – 2021-10-27 (×4): 2 via ORAL
  Filled 2021-10-25 (×6): qty 2

## 2021-10-25 MED ORDER — SODIUM CHLORIDE 0.9 % IV SOLN
250.0000 mg | Freq: Once | INTRAVENOUS | Status: AC
  Administered 2021-10-25: 250 mg via INTRAVENOUS
  Filled 2021-10-25 (×2): qty 20

## 2021-10-25 MED ORDER — VALACYCLOVIR HCL 500 MG PO TABS
500.0000 mg | ORAL_TABLET | Freq: Every day | ORAL | Status: DC
  Administered 2021-10-25 – 2021-10-27 (×3): 500 mg via ORAL
  Filled 2021-10-25 (×4): qty 1

## 2021-10-25 MED ORDER — SENNA 8.6 MG PO TABS: 1.0000 | ORAL_TABLET | Freq: Every day | ORAL | Status: DC

## 2021-10-25 MED ORDER — OXYCODONE HCL 5 MG PO TABS
15.0000 mg | ORAL_TABLET | Freq: Four times a day (QID) | ORAL | Status: DC | PRN
  Administered 2021-10-25 – 2021-10-27 (×8): 15 mg via ORAL
  Filled 2021-10-25 (×8): qty 3

## 2021-10-25 MED ORDER — SODIUM CHLORIDE 0.9% FLUSH: 3.0000 mL | INTRAVENOUS | Status: DC | PRN

## 2021-10-25 MED ORDER — GABAPENTIN 600 MG PO TABS
600.0000 mg | ORAL_TABLET | Freq: Three times a day (TID) | ORAL | Status: DC
  Administered 2021-10-25 – 2021-10-27 (×7): 600 mg via ORAL
  Filled 2021-10-25 (×7): qty 1

## 2021-10-25 MED ORDER — BUSPIRONE HCL 5 MG PO TABS
15.0000 mg | ORAL_TABLET | Freq: Two times a day (BID) | ORAL | Status: DC
  Administered 2021-10-25 – 2021-10-27 (×5): 15 mg via ORAL
  Filled 2021-10-25 (×5): qty 3

## 2021-10-25 MED ORDER — PEG-KCL-NACL-NASULF-NA ASC-C 100 G PO SOLR
0.5000 | Freq: Once | ORAL | Status: AC
  Administered 2021-10-25: 100 g via ORAL

## 2021-10-25 MED ORDER — PEG-KCL-NACL-NASULF-NA ASC-C 100 G PO SOLR
0.5000 | Freq: Once | ORAL | Status: AC
  Administered 2021-10-25: 100 g via ORAL
  Filled 2021-10-25 (×2): qty 1

## 2021-10-25 MED ORDER — ACETAMINOPHEN 325 MG PO TABS
650.0000 mg | ORAL_TABLET | Freq: Four times a day (QID) | ORAL | Status: DC | PRN
  Administered 2021-10-25 – 2021-10-27 (×4): 650 mg via ORAL
  Filled 2021-10-25 (×4): qty 2

## 2021-10-25 MED ORDER — SODIUM CHLORIDE 0.9 % IV SOLN: 250.0000 mL | INTRAVENOUS | Status: DC | PRN

## 2021-10-25 MED ORDER — ACETAMINOPHEN 650 MG RE SUPP: 650.0000 mg | Freq: Four times a day (QID) | RECTAL | Status: DC | PRN

## 2021-10-25 MED ORDER — SODIUM CHLORIDE 0.9% IV SOLUTION: Freq: Once | INTRAVENOUS | Status: DC

## 2021-10-25 MED ORDER — FOLIC ACID 1 MG PO TABS
2.0000 mg | ORAL_TABLET | Freq: Every day | ORAL | Status: DC
Start: 2021-10-25 — End: 2021-10-27
  Administered 2021-10-25 – 2021-10-27 (×3): 2 mg via ORAL
  Filled 2021-10-25 (×3): qty 2

## 2021-10-25 MED ORDER — BUPRENORPHINE 10 MCG/HR TD PTWK: 2.0000 | MEDICATED_PATCH | TRANSDERMAL | Status: DC

## 2021-10-25 MED ORDER — TIZANIDINE HCL 4 MG PO TABS
4.0000 mg | ORAL_TABLET | Freq: Three times a day (TID) | ORAL | Status: DC | PRN
Start: 2021-10-25 — End: 2021-10-27
  Administered 2021-10-25 – 2021-10-26 (×4): 4 mg via ORAL
  Filled 2021-10-25 (×4): qty 1

## 2021-10-25 MED ORDER — ONDANSETRON HCL 4 MG/2ML IJ SOLN
4.0000 mg | Freq: Four times a day (QID) | INTRAMUSCULAR | Status: DC | PRN
  Administered 2021-10-25: 4 mg via INTRAVENOUS
  Filled 2021-10-25: qty 2

## 2021-10-25 MED ORDER — PEG-KCL-NACL-NASULF-NA ASC-C 100 G PO SOLR: 1.0000 | Freq: Once | ORAL | Status: DC

## 2021-10-25 MED ORDER — SODIUM CHLORIDE 0.9% FLUSH
3.0000 mL | Freq: Two times a day (BID) | INTRAVENOUS | Status: DC
Start: 2021-10-25 — End: 2021-10-27
  Administered 2021-10-25 – 2021-10-27 (×4): 3 mL via INTRAVENOUS

## 2021-10-25 NOTE — H&P (View-Only) (Signed)
[facility] Consult: 9:30 AM 10/25/2021  LOS: 0 days    Referring Provider: Trude Mcburney  Primary Care Physician:  Katherina Mires, MD [facility]. Primary Gastroenterologist: Althia Forts.   Reason for Consultation: Progressive, severe, symptomatic iron deficiency anemia.   HPI: Kelsey Lynch is a 36 y.o. female.  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  However patient denies knowledge of this medical term and has never seen a hematologist.  GERD.  Migraines.  Depression. Patient amenorrheic.  Patient has history of narcotic use disorder and is under pain management for chronic low back pain.  Patient's basically been taking iron since her latest pregnancy in 2021, was on over-the-counter iron.   In May Hgb was 7.5, pain management referred her to PCP and she started on prescription strength oral iron supplement bid.  FOBT testing was positive.  On 6/26 iron level low at 17, iron saturation 4% and ferritin 15, normal folate and B12 stopped taking NSAIDs.  Recheck Hb 6.8, oral iron increased to tid.  Referred to GI and was supposed to see [place] PA at [place] today.  However on recheck Hgb yesterday it had dropped to 5.8 so she was sent to the ER where she presented yesterday evening. Symptoms include generalized weakness, fatigue.  Intermittent dizziness.  Dyspnea on exertion.  Amenorrheic for a few years as she is on Depo-Provera.  Stools are brown, never sees blood or melena.  Reports bad heartburn with nausea, daily basis and occasional bilious vomiting.  P.o.  intake decreased because of the nausea and she is dropped about 10 pounds in the last month.  Previously was on omeprazole 20 mg daily, recently a new medication was prescribed starting with an ""F" (famotidine?),  But she has not picked the prescription up yet.  A few days ago she developed painful erythematous nodules on her arms and legs and swelling in feet.  Pain makes it difficult to walk..  Reports a syncopal spell in March.  Denies chest, abdominal pain, melena, hematochezia, BRBPR.  Hgb 5.9.  MCV 57.  Platelets and WBCs normal.  INR 1.1   ESR 34.  CRP 8.6.  TSH normal BUN, creatinine normal.  LFTs normal with exception of albumin 2.9 ANA pending. Has received 2 PRBCs so far   Family history negative for GI diseases or cancers, anemia.  She cites diabetes and hypertension as problems in her close relatives. 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  Lives at home with her mom and her 41 and 44-year-old children.  She is not married.  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 Authorizing Provider  buprenorphine (BUTRANS) 20 MCG/HR PTWK Place 1 patch onto the skin once a week. 09/08/21  Yes [provider]  busPIRone (BUSPAR) 15 MG tablet Take 15 mg by mouth 2 (two) times daily. 09/20/21  Yes [provider]  ferrous sulfate 324 (65 Fe) MG TBEC Take 1 tablet by mouth in the morning, at noon, and at bedtime. 09/22/21  Yes [provider]  gabapentin (NEURONTIN) 600 MG tablet Take 600 mg by mouth in the morning, at noon, in the evening, and at bedtime. 10/07/21  Yes [provider]  medroxyPROGESTERone (DEPO-PROVERA) 150 MG/ML injection Inject 150 mg into the muscle every 3 (three) months. 10/29/19  Yes [provider]  oxyCODONE (ROXICODONE) 15 MG immediate release tablet Take 15 mg by mouth every 6 (six) hours as needed. 10/11/21  Yes [provider]  tiZANidine (ZANAFLEX) 4 MG tablet Take 4 mg by mouth in the morning, at noon, in the evening, and at bedtime. 02/12/20  Yes [provider]  valACYclovir (VALTREX) 500 MG tablet Take 500 mg by mouth daily. 09/20/21  Yes [provider]  Vitamin D, Ergocalciferol, (DRISDOL) 1.25 MG (50000 UNIT) CAPS capsule Take 50,000 Units by mouth once a week. 09/07/21  Yes [provider]  famotidine (PEPCID) 40 MG tablet Take by mouth. 10/10/21   [provider]  naloxone [facility]) nasal spray 4 mg/0.1 mL Place 1 spray into the nose once. 10/20/20   [provider]  oxyCODONE (OXY IR/ROXICODONE) 5 MG immediate release tablet Take 1 tablet (5 mg total) by mouth every 4 (four) hours as needed (pain scale 4-7). Patient not taking: Reported on 10/24/2021 09/08/19   Allyn Kenner, DO    Scheduled Meds:  sodium chloride   Intravenous Once   busPIRone  15 mg Oral BID   folic acid  2 mg Oral Daily   gabapentin  600 mg Oral TID   pantoprazole  40 mg Oral Q1200   senna  1 tablet Oral QHS   sodium chloride flush  3 mL Intravenous Q12H   valACYclovir  500 mg Oral Daily   Infusions:  sodium chloride Stopped (10/25/21 0006)   sodium chloride     ferric gluconate (FERRLECIT) IVPB     PRN Meds: sodium chloride, acetaminophen **OR** acetaminophen, HYDROcodone-acetaminophen, oxyCODONE, sodium chloride flush   Allergies as of 10/24/2021 - Review Complete 10/24/2021  Allergen Reaction Noted   Penicillins Hives and  Anaphylaxis 01/23/2015   Shellfish allergy Anaphylaxis and Swelling 02/05/2013   Hydroxyzine Rash 09/20/2021   Meloxicam Rash 07/20/2015    Family History  Problem Relation Age of Onset   Diabetes Paternal Grandmother    Hypertension Paternal Grandmother     Social History   Socioeconomic History   Marital status: Single    Spouse name: Not on file   Number of children: 1   Years of education: HS   Highest education level: Not on file  Occupational History   Occupation: Best boy  Tobacco  Use   Smoking status: Former    Types: Cigarettes   Smokeless tobacco: Never   Tobacco comments:    Smokes 1-2 times per month.  Vaping Use   Vaping Use: Never used  Substance and Sexual Activity   Alcohol use: Not Currently    Alcohol/week: 0.0 standard drinks of alcohol    Comment: Socially   Drug use: Yes    Types: Oxycodone   Sexual activity: Yes  Other Topics Concern   Not on file  Social History Narrative   Lives at home with her son and her sister.   Right-handed.   Drinks caffeine 1-2 times weekly.   Social Determinants of Health   Financial Resource Strain: Not on file  Food Insecurity: Not on file  Transportation Needs: Not on file  Physical Activity: Not on file  Stress: Not on file  Social Connections: Not on file  Intimate Partner Violence: Not on file    REVIEW OF SYSTEMS: Constitutional: Weakness, fatigue ENT:  No nose bleeds Pulm: Dyspnea on exertion CV:  No palpitations, no LE edema.  Burning heartburn type chest pain.  No anginal symptoms. GU:  No hematuria, no frequency GI: See HPI. Heme: Denies unusual or excessive bleeding or bruising. Transfusions: None. Neuro:  No headaches, no peripheral tingling or numbness Derm:  No itching, no rash or sores.  Endocrine:  No sweats or chills.  No polyuria or dysuria Immunization:  not queried.   Travel:  None beyond local counties in last few months.    PHYSICAL EXAM: Vital signs in last  24 hours: Vitals:   10/25/21 0808 10/25/21 0825  BP: 110/73 118/71  Pulse: 69 71  Resp: 16 15  Temp: 98.3 F (36.8 C) 98.9 F (37.2 C)  SpO2: 100% 100%   Wt Readings from Last 3 Encounters:  10/24/21 81.2 kg  09/05/19 87.1 kg  04/16/19 74 kg    General: Healthy, moderately obese.  Comfortable. Head: No facial asymmetry or swelling.  No signs of head trauma. Eyes: Conjunctiva slightly pale.  No scleral icterus Ears: Not hard of hearing Nose: No congestion or discharge Mouth: Oropharynx moist, pink, clear.  Good dentition.  Tongue midline Neck: No masses, no thyromegaly, no JVD Lungs: Clear bilaterally without labored breathing or cough. Heart: RRR.  No MRG.  S1, S2 present. Abdomen: Obese, soft.  No tenderness, no masses, HSM, bruits, hernias.  Active bowel sounds..   Rectal: Not performed Musc/Skeltl: No obvious joint redness, swelling or gross deformity. Extremities: None pitting edema in the lower legs, feet. Neurologic: No tremors or limb weakness.  Fluid speech.  Oriented x3.  Excellent historian. Skin: Multiple painful erythematous nodules on the arms and legs.  None seen on the trunk. Tattoos: Professional quality tattoos on the trunk, patient says these were done about 10 years ago. Nodes: No cervical adenopathy Psych: Cooperative, calm, pleasant.  Intake/Output from previous day: 06/26 0701 - 06/27 0700 In: 64 [Blood:64] Out: -  Intake/Output this shift: No intake/output data recorded.  LAB RESULTS: Recent Labs    10/24/21 2030  WBC 8.3  HGB 5.9*  HCT 22.0*  PLT 341   BMET Lab Results  Component Value Date   NA 135 10/24/2021   NA 138 07/03/2015   NA 139 11/22/2013   K 4.1 10/24/2021   K 3.9 07/03/2015   K 3.8 11/22/2013   CL 107 10/24/2021   CL 103 07/03/2015   CL 104 11/22/2013   CO2 21 (L) 10/24/2021   CO2  25 07/03/2015   CO2 23 11/22/2013   GLUCOSE 115 (H) 10/24/2021   GLUCOSE 97 07/03/2015   GLUCOSE 87 11/22/2013   BUN 15 10/24/2021    BUN 12 07/03/2015   BUN 10 11/22/2013   CREATININE 0.89 10/24/2021   CREATININE 0.70 07/03/2015   CREATININE 0.64 11/22/2013   CALCIUM 8.1 (L) 10/24/2021   CALCIUM 8.6 (L) 07/03/2015   CALCIUM 9.2 11/22/2013   LFT Recent Labs    10/24/21 2030  PROT 6.0*  ALBUMIN 2.9*  AST 12*  ALT 13  ALKPHOS 74  BILITOT 0.4   PT/INR Lab Results  Component Value Date   INR 1.1 10/25/2021   Hepatitis Panel No results for input(s): "HEPBSAG", "HCVAB", "HEPAIGM", "HEPBIGM" in the last 72 hours. C-Diff No components found for: "CDIFF" Lipase     Component Value Date/Time   LIPASE 48 09/13/2010 1305    Drugs of Abuse  No results found for: "LABOPIA", "COCAINSCRNUR", "LABBENZ", "AMPHETMU", "THCU", "LABBARB"   RADIOLOGY STUDIES: DG CHEST PORT 1 VIEW  Result Date: 10/25/2021 CLINICAL DATA:  Low serum hemoglobin. EXAM: PORTABLE CHEST 1 VIEW COMPARISON:  PA Lat 04/07/2009 FINDINGS: The heart size and mediastinal contours are within normal limits, but with the heart upper-normal in size compared to the prior exam. Both lungs are clear. The visualized skeletal structures are unremarkable. IMPRESSION: Interval increased borderline prominence of the heart silhouette since the prior study. No vascular congestion or other acute radiographic chest findings. Electronically Signed   By: Telford Nab M.D.   On: 10/25/2021 01:48      IMPRESSION:   Progressive microcytic anemia despite oral iron supplement.  Listed in past medical history is Hgb constant spring trait.  However patient has never seen a hematologist and has no awareness of this as a diagnosis and there is no family history of anemia.  Third unit PRBCs currently transfusing.  Heme positive stool  GERD  Chronic back pain, opioid dependent, followed at pain management clinic.  Acute irruption of painful erythematous nodules on legs and arms, ?erythema nodosum?  Elevated CRP and ESR.  Low prealbumin at 9.4.   PLAN:       Colonoscopy and EGD tomorrow morning.  Pt agreeable to proceed.       Agree w heme consult.      Check HCV, Hep B surface Ab and Ag, Hep B core Ab   Azucena Freed  10/25/2021, 9:30 AM Phone [phone]  GI attending  History, laboratories, x-rays reviewed.  Patient seen and examined.  Agree with comprehensive consultation note as outlined above.  Patient presents with symptomatic iron deficiency anemia.  Question of underlying hemoglobinopathy as well.  Heme positive stool.  Agree with plans for colonoscopy and upper endoscopy as well as hematology evaluation.The nature of the procedure, as well as the risks, benefits, and alternatives were carefully and thoroughly reviewed with the patient. Ample time for discussion and questions allowed. The patient understood, was satisfied, and agreed to proceed.  Docia Chuck. Geri Seminole., M.D. [facility] Division of Gastroenterology

## 2021-10-25 NOTE — Care Management (Signed)
  Transition of Care (TOC) Screening Note   Patient Details  Name: Kelsey Lynch Date of Birth: 05/29/1985   Transition of Care [facility]) CM/SW Contact:    Lawerance Sabal, RN Phone Number: 10/25/2021, 9:17 AM    Transition of Care Department [facility]) has reviewed patient and no TOC needs have been identified at this time. We will continue to monitor patient advancement through interdisciplinary progression rounds. If new patient transition needs arise, please place a TOC consult.

## 2021-10-25 NOTE — ED Notes (Signed)
Pt c/o acid reflux. Ghimire MD messaged via secure chat regarding pts complaint.

## 2021-10-25 NOTE — Consult Note (Addendum)
[facility]  Telephone:(336) [phone] Fax:(336) [phone]    INITIAL HEMATOLOGY CONSULTATION  Referring MD:  Dr Jeoffrey MassedShanker Ghimire  Reason for Referral: Microcytic anemia  HPI: [name] is a 36 year old female with a past medical history significant for anemia and chronic pain.  She presented to the emergency department with a low hemoglobin.  Was sent by her PCP due to anemia.  She has been experiencing fatigue.  She had screening labs performed a few months ago was noted to have a hemoglobin of 7.5 and was started on oral iron 3 times a day.  Hemoglobin on admission was 5.9 with an MCV of 57.9.  Her white blood cell count and platelets are normal.  Her T. bili was normal, vitamin B12 level was normal at 341, folate low normal at 6, ferritin 15, iron 17, TIBC 407, percent saturation 4%, reticulocytes could not be evaluated due to to a "interfering substance." she has had heme positive stools in the past and had an appointment with [facility] on 10/25/2021.  Was seen earlier today by [facility] in the hospital.  The patient has also been experiencing "bruising" on her arms and legs bilaterally.  The patient has received 3 units PRBC so far this admission.  GI planning on EGD and colonoscopy tomorrow.  There is a mention in the patient's history that she has hemoglobin constant spring trait but the patient is unaware of this and lab informing this is not available to me.  The patient reports that she has been experiencing fatigue, dizziness, shortness of breath with exertion, and mild chest discomfort recently.  She also reports "bruising" all over her arms and legs which is painful to touch.  She also has lower extremity edema. She reports her appetite has been decreased and she has lost about 10 pounds in 1 month.  She is not having any headaches, abdominal pain, nausea, vomiting.  Denies constipation and diarrhea.  She has not seen any melena or hematochezia.  Currently receiving Depo  injections and does not get regular periods.  Prior to this hospitalization, she has never received a blood transfusion before.  Her chart indicates in the history that she has a hemoglobin constant spring trait which she states she is not aware of.  Again, she has never personally required any blood transfusions prior to this hospitalization and denies family history of anemia and transfusions.  The patient has been taking oral iron on a regular basis for the past 2 months.  The patient's family is originally from [place].  She is single and has 2 children.  Denies history of alcohol and tobacco use.  Hematology was asked see the patient make recommendations regarding her anemia.  Past Medical History:  Diagnosis Date   Anemia    Back pain, chronic    Depression    Headache(784.0)    Migraines   Hemoglobin Constant Spring trait    Hemoglobin Constant Spring trait    HSV infection    no outbreak during pregnancy   Infection    UTI   Neck pain    Opioid dependence in remission (HCC)    STD (sexually transmitted disease)    herpes   Vaginal Pap smear, abnormal    bx and trx  : Past Surgical History:  Procedure Laterality Date   NO PAST SURGERIES    : CURRENT MEDS: Current Facility-Administered Medications  Medication Dose Route Frequency Provider Last Rate Last Admin   0.9 %  sodium chloride  infusion (Manually program via Guardrails IV Fluids)   Intravenous Once Doutova, Anastassia, MD       0.9 %  sodium chloride infusion  10 mL/hr Intravenous Once Therisa Doyne, MD   Held at 10/25/21 0006   0.9 %  sodium chloride infusion  250 mL Intravenous PRN Therisa Doyne, MD       acetaminophen (TYLENOL) tablet 650 mg  650 mg Oral Q6H PRN Therisa Doyne, MD   650 mg at 10/25/21 5638   Or   acetaminophen (TYLENOL) suppository 650 mg  650 mg Rectal Q6H PRN Therisa Doyne, MD       busPIRone (BUSPAR) tablet 15 mg  15 mg Oral BID Doutova, Anastassia, MD       ferric gluconate  (FERRLECIT) 250 mg in sodium chloride 0.9 % 250 mL IVPB  250 mg Intravenous Once Doutova, Jonny Ruiz, MD       folic acid (FOLVITE) tablet 2 mg  2 mg Oral Daily Doutova, Anastassia, MD       gabapentin (NEURONTIN) tablet 600 mg  600 mg Oral TID Therisa Doyne, MD       HYDROcodone-acetaminophen (NORCO/VICODIN) 5-325 MG per tablet 1-2 tablet  1-2 tablet Oral Q4H PRN Doutova, Anastassia, MD       oxyCODONE (Oxy IR/ROXICODONE) immediate release tablet 15 mg  15 mg Oral Q6H PRN Therisa Doyne, MD   15 mg at 10/25/21 0729   senna (SENOKOT) tablet 8.6 mg  1 tablet Oral QHS Doutova, Anastassia, MD       sodium chloride flush (NS) 0.9 % injection 3 mL  3 mL Intravenous Q12H Doutova, Anastassia, MD       sodium chloride flush (NS) 0.9 % injection 3 mL  3 mL Intravenous PRN Doutova, Anastassia, MD       valACYclovir (VALTREX) tablet 500 mg  500 mg Oral Daily Doutova, Anastassia, MD       Current Outpatient Medications  Medication Sig Dispense Refill   buprenorphine (BUTRANS) 20 MCG/HR PTWK Place 1 patch onto the skin once a week.     busPIRone (BUSPAR) 15 MG tablet Take 15 mg by mouth 2 (two) times daily.     ferrous sulfate 324 (65 Fe) MG TBEC Take 1 tablet by mouth in the morning, at noon, and at bedtime.     gabapentin (NEURONTIN) 600 MG tablet Take 600 mg by mouth in the morning, at noon, in the evening, and at bedtime.     medroxyPROGESTERone (DEPO-PROVERA) 150 MG/ML injection Inject 150 mg into the muscle every 3 (three) months.     oxyCODONE (ROXICODONE) 15 MG immediate release tablet Take 15 mg by mouth every 6 (six) hours as needed.     tiZANidine (ZANAFLEX) 4 MG tablet Take 4 mg by mouth in the morning, at noon, in the evening, and at bedtime.     valACYclovir (VALTREX) 500 MG tablet Take 500 mg by mouth daily.     Vitamin D, Ergocalciferol, (DRISDOL) 1.25 MG (50000 UNIT) CAPS capsule Take 50,000 Units by mouth once a week.     famotidine (PEPCID) 40 MG tablet Take by mouth.      naloxone (NARCAN) nasal spray 4 mg/0.1 mL Place 1 spray into the nose once.     oxyCODONE (OXY IR/ROXICODONE) 5 MG immediate release tablet Take 1 tablet (5 mg total) by mouth every 4 (four) hours as needed (pain scale 4-7). (Patient not taking: Reported on 10/24/2021) 30 tablet 0   Allergies  Allergen Reactions   Penicillins Hives  and Anaphylaxis    Has patient had a PCN reaction causing immediate rash, facial/tongue/throat swelling, SOB or lightheadedness with hypotension: Yes  Has patient had a PCN reaction causing severe rash involving mucus membranes or skin necrosis: Yes Has patient had a PCN reaction that required hospitalization No Has patient had a PCN reaction occurring within the last 10 years: Yes  If all of the above answers are "NO", then may proceed with Cephalosporin use.  Has patient had a PCN reaction causing immediate rash, facial/tongue/throat swelling, SOB or lightheadedness with hypotension: Yes  Has patient had a PCN reaction causing severe rash involving mucus membranes or skin necrosis: Yes Has patient had a PCN reaction that required hospitalization No Has patient had a PCN reaction occurring within the last 10 years: Yes  If all of the above answers are "NO", then may proceed with Cephalosporin use.    Shellfish Allergy Anaphylaxis and Swelling   Hydroxyzine Rash   Meloxicam Rash  : Family History  Problem Relation Age of Onset   Diabetes Paternal Grandmother    Hypertension Paternal Grandmother   : Social History   Socioeconomic History   Marital status: Single    Spouse name: Not on file   Number of children: 1   Years of education: HS   Highest education level: Not on file  Occupational History   Occupation: Technical sales engineer  Tobacco Use   Smoking status: Former    Types: Cigarettes   Smokeless tobacco: Never   Tobacco comments:    Smokes 1-2 times per month.  Vaping Use   Vaping Use: Never used  Substance and Sexual Activity   Alcohol  use: Not Currently    Alcohol/week: 0.0 standard drinks of alcohol    Comment: Socially   Drug use: Yes    Types: Oxycodone   Sexual activity: Yes  Other Topics Concern   Not on file  Social History Narrative   Lives at home with her son and her sister.   Right-handed.   Drinks caffeine 1-2 times weekly.   Social Determinants of Health   Financial Resource Strain: Not on file  Food Insecurity: Not on file  Transportation Needs: Not on file  Physical Activity: Not on file  Stress: Not on file  Social Connections: Not on file  Intimate Partner Violence: Not on file  :  REVIEW OF SYSTEMS:  A comprehensive 14 point review of systems was negative except as noted in the HPI.    Exam: Patient Vitals for the past 24 hrs:  BP Temp Temp src Pulse Resp SpO2 Height Weight  10/25/21 0808 110/73 98.3 F (36.8 C) Oral 69 16 100 % -- --  10/25/21 0731 111/70 98.5 F (36.9 C) Oral 72 15 100 % -- --  10/25/21 0724 111/70 -- -- 72 17 100 % -- --  10/25/21 0630 -- -- -- 73 17 100 % -- --  10/25/21 0600 115/71 -- -- 72 16 100 % -- --  10/25/21 0532 (!) 108/58 98.4 F (36.9 C) Oral 75 18 100 % -- --  10/25/21 0531 (!) 108/58 -- -- 76 15 100 % -- --  10/25/21 0527 (!) 104/57 98.6 F (37 C) Oral 75 18 100 % -- --  10/25/21 0500 120/62 98.4 F (36.9 C) Oral 75 18 100 % -- --  10/25/21 0330 123/63 -- -- -- 13 -- -- --  10/25/21 0325 95/61 -- -- 76 15 99 % -- --  10/25/21 0200 105/68 -- --  72 13 100 % -- --  10/25/21 0130 (!) 106/55 -- -- 87 -- 100 % -- --  10/25/21 0000 (!) 105/59 -- -- 71 14 100 % -- --  10/24/21 2350 (!) 105/59 98.4 F (36.9 C) Oral 75 16 100 % -- --  10/24/21 2349 (!) 102/58 98.4 F (36.9 C) Oral 72 16 100 % -- --  10/24/21 2338 (!) 106/59 -- -- 80 16 96 % -- --  10/24/21 2330 111/61 98.6 F (37 C) Oral 73 16 100 % -- --  10/24/21 2300 (!) 112/46 -- -- 76 14 100 % -- --  10/24/21 2230 119/64 -- -- 75 17 100 % -- --  10/24/21 2126 (!) 122/57 -- -- 81 19 100 % -- --   10/24/21 2009 94/74 98.5 F (36.9 C) Oral 76 18 100 % -- --  10/24/21 2007 -- -- -- -- -- -- 5\' 2"  (1.575 m) 81.2 kg    Physical Exam Vitals reviewed.  Constitutional:      General: She is not in acute distress. HENT:     Head: Normocephalic.     Mouth/Throat:     Pharynx: No oropharyngeal exudate or posterior oropharyngeal erythema.  Eyes:     General: No scleral icterus.    Conjunctiva/sclera: Conjunctivae normal.  Cardiovascular:     Rate and Rhythm: Normal rate.     Comments: Edema to the bilateral lower extremities, nonpitting Pulmonary:     Breath sounds: Normal breath sounds.  Abdominal:     General: There is no distension.     Palpations: Abdomen is soft.     Tenderness: There is no abdominal tenderness.  Skin:    General: Skin is warm and dry.     Comments: Multiple painful erythematous nodules on the arms and legs.  Neurological:     Mental Status: She is alert and oriented to person, place, and time.    LABS:  Lab Results  Component Value Date   WBC 8.3 10/24/2021   HGB 5.9 (LL) 10/24/2021   HCT 22.0 (L) 10/24/2021   PLT 341 10/24/2021   GLUCOSE 115 (H) 10/24/2021   ALT 13 10/24/2021   AST 12 (L) 10/24/2021   NA 135 10/24/2021   K 4.1 10/24/2021   CL 107 10/24/2021   CREATININE 0.89 10/24/2021   BUN 15 10/24/2021   CO2 21 (L) 10/24/2021   INR 1.1 10/25/2021    DG CHEST PORT 1 VIEW  Result Date: 10/25/2021 CLINICAL DATA:  Low serum hemoglobin. EXAM: PORTABLE CHEST 1 VIEW COMPARISON:  PA Lat 04/07/2009 FINDINGS: The heart size and mediastinal contours are within normal limits, but with the heart upper-normal in size compared to the prior exam. Both lungs are clear. The visualized skeletal structures are unremarkable. IMPRESSION: Interval increased borderline prominence of the heart silhouette since the prior study. No vascular congestion or other acute radiographic chest findings. Electronically Signed   By: 14/11/2008 M.D.   On: 10/25/2021 01:48      ASSESSMENT AND PLAN:  1.  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  Heme positive stools 3.  GERD 4.  Chronic back pain 5.  Painful erythematous rash to arms and legs  -Labs reviewed and discussed with the patient.  She has no evidence of hemolysis on her lab work.  Ferritin is low normal with a low iron and percent saturation which is consistent with iron deficiency.  Folate level low normal. -She has no clear source of  bleeding other than heme positive stools.  However, she does not see any melena or hematochezia.  She has been taking oral iron and despite this, anemia has been progressive. -Agree with GI work-up including EGD and colonoscopy. -Recommend PRBC transfusion to keep hemoglobin above 7. -Recommend proceeding with a dose of IV iron and for folate acid replacement. -Hemoglobin fractionation cascade has been sent to better characterize her microcytic anemia.  The patient is unaware of any hemoglobin constant spring trait, has never received a PRBC transfusion prior to this admission, and denies family history of anemia and blood transfusions.  It is possible that she may have a thalassemia trait, but unclear how much this is contributing to her acute anemia. -Etiology of the rash unclear.  Will defer to primary team for further work-up.  Thank you for this referral.  Clenton Pare, DNP, AGPCNP-BC, AOCNP   ADDENDUM: I saw and examined Ms. Gossett this morning.  I must say that she is quite nice.  I have no idea where this diagnosis of hemoglobin Constant Spring trait comes from.  She is not aware of anything like this..  She clearly is iron deficient.  She does not have any obvious antibody.  I am not sure why the reticulocyte count could not be done.  She is going for a colonoscopy and possibly upper endoscopy today.  She does have this rash on her legs.  Looks like some kind of macular rash.  It is in various areas.  There is little bit of  tenderness when you palpate it.  I do not know if this might need to be biopsied.  I would not think this would be any kind of vasculitis.  She does not have a monthly cycle.  Again, being from [place], I had believe that she probably does have a hemoglobinopathy.  Hemoglobin E is very common in the [place] Asian population.  She has had no lab work yet done today.  She definitely needs to be on folic acid if she does have a hemoglobinopathy.  She is not going to absorb oral iron.  She is on an acid blocker that would prevent her from being absorbed all that well.  When she came in, her ferritin was only 15 with an iron saturation of 4%.  She has gone blood and IV iron.  I am sure she will need more IV iron.  We will follow her along.  I just feel bad that she has this chronic pain issue.  I know that she will get incredible care from everybody up on [address].  Christin Bach, MD  Fayrene Fearing 1:5

## 2021-10-25 NOTE — Assessment & Plan Note (Signed)
Given significant edema and tenderness will obtain Dopplers to rule out DVT

## 2021-10-25 NOTE — Assessment & Plan Note (Signed)
Noted history of anemia dating back at least few years.  But has been progressive lately.  On history listed as hemoglobinopathy But patient is unaware of any  Denies any GI blood loss or vaginal blood loss Obtain anemia panel ER provider discussed case with hematology who recommends continuing blood transfusion,  IV iron. started on folic acid Order hemoglobin electrophoresis and they will see in consult

## 2021-10-26 ENCOUNTER — Other Ambulatory Visit (HOSPITAL_COMMUNITY)
Admission: RE | Admit: 2021-10-26 | Discharge: 2021-10-26 | Disposition: A | Discharge status: Home / Self Care | Payer: Medicaid Other | Source: Ambulatory Visit | Attending: Internal Medicine | Admitting: Internal Medicine

## 2021-10-26 ENCOUNTER — Telehealth: Payer: Self-pay

## 2021-10-26 ENCOUNTER — Encounter (HOSPITAL_COMMUNITY)
Admission: EM | Disposition: A | Discharge status: Home / Self Care | Payer: Self-pay | Source: Home / Self Care | Attending: Internal Medicine

## 2021-10-26 ENCOUNTER — Inpatient Hospital Stay (HOSPITAL_COMMUNITY): Payer: Medicaid Other | Admitting: Certified Registered Nurse Anesthetist

## 2021-10-26 ENCOUNTER — Encounter (HOSPITAL_COMMUNITY): Payer: Self-pay | Admitting: Internal Medicine

## 2021-10-26 DIAGNOSIS — D509 Iron deficiency anemia, unspecified: Secondary | ICD-10-CM | POA: Diagnosis not present

## 2021-10-26 DIAGNOSIS — D649 Anemia, unspecified: Secondary | ICD-10-CM | POA: Diagnosis not present

## 2021-10-26 DIAGNOSIS — K633 Ulcer of intestine: Secondary | ICD-10-CM | POA: Diagnosis not present

## 2021-10-26 DIAGNOSIS — F419 Anxiety disorder, unspecified: Secondary | ICD-10-CM | POA: Diagnosis not present

## 2021-10-26 DIAGNOSIS — K311 Adult hypertrophic pyloric stenosis: Secondary | ICD-10-CM

## 2021-10-26 DIAGNOSIS — G8929 Other chronic pain: Secondary | ICD-10-CM | POA: Diagnosis not present

## 2021-10-26 DIAGNOSIS — L52 Erythema nodosum: Secondary | ICD-10-CM | POA: Diagnosis not present

## 2021-10-26 DIAGNOSIS — R195 Other fecal abnormalities: Secondary | ICD-10-CM

## 2021-10-26 DIAGNOSIS — K254 Chronic or unspecified gastric ulcer with hemorrhage: Secondary | ICD-10-CM

## 2021-10-26 DIAGNOSIS — F418 Other specified anxiety disorders: Secondary | ICD-10-CM

## 2021-10-26 DIAGNOSIS — K259 Gastric ulcer, unspecified as acute or chronic, without hemorrhage or perforation: Secondary | ICD-10-CM | POA: Diagnosis not present

## 2021-10-26 HISTORY — PX: COLONOSCOPY WITH PROPOFOL: SHX5780

## 2021-10-26 HISTORY — PX: ESOPHAGOGASTRODUODENOSCOPY: SHX5428

## 2021-10-26 HISTORY — PX: BIOPSY: SHX5522

## 2021-10-26 LAB — CBC WITH DIFFERENTIAL/PLATELET
Abs Immature Granulocytes: 0.04 10*3/uL (ref 0.00–0.07)
Basophils Absolute: 0.1 10*3/uL (ref 0.0–0.1)
Basophils Relative: 1 %
Eosinophils Absolute: 0.5 10*3/uL (ref 0.0–0.5)
Eosinophils Relative: 9 %
HCT: 28.6 % — ABNORMAL LOW (ref 36.0–46.0)
Hemoglobin: 8.5 g/dL — ABNORMAL LOW (ref 12.0–15.0)
Immature Granulocytes: 1 %
Lymphocytes Relative: 38 %
Lymphs Abs: 1.9 10*3/uL (ref 0.7–4.0)
MCH: 18.6 pg — ABNORMAL LOW (ref 26.0–34.0)
MCHC: 29.7 g/dL — ABNORMAL LOW (ref 30.0–36.0)
MCV: 62.4 fL — ABNORMAL LOW (ref 80.0–100.0)
Monocytes Absolute: 0.6 10*3/uL (ref 0.1–1.0)
Monocytes Relative: 12 %
Neutro Abs: 2 10*3/uL (ref 1.7–7.7)
Neutrophils Relative %: 39 %
Platelets: 349 10*3/uL (ref 150–400)
RBC: 4.58 MIL/uL (ref 3.87–5.11)
RDW: 29.1 % — ABNORMAL HIGH (ref 11.5–15.5)
WBC: 5.1 10*3/uL (ref 4.0–10.5)
nRBC: 1 % — ABNORMAL HIGH (ref 0.0–0.2)

## 2021-10-26 LAB — BPAM RBC
Blood Product Expiration Date: 202307172359
Blood Product Expiration Date: 202307172359
Blood Product Expiration Date: 202307172359
ISSUE DATE / TIME: 202306262325
ISSUE DATE / TIME: 202306270448
ISSUE DATE / TIME: 202306270759
Unit Type and Rh: 7300
Unit Type and Rh: 7300
Unit Type and Rh: 7300

## 2021-10-26 LAB — TYPE AND SCREEN
ABO/RH(D): B POS
Antibody Screen: NEGATIVE
Unit division: 0
Unit division: 0
Unit division: 0

## 2021-10-26 LAB — RETICULOCYTES
Immature Retic Fract: 38.4 % — ABNORMAL HIGH (ref 2.3–15.9)
RBC.: 4.68 MIL/uL (ref 3.87–5.11)
Retic Count, Absolute: 77.2 10*3/uL (ref 19.0–186.0)
Retic Ct Pct: 1.7 % (ref 0.4–3.1)

## 2021-10-26 LAB — ANA W/REFLEX IF POSITIVE: Anti Nuclear Antibody (ANA): NEGATIVE

## 2021-10-26 SURGERY — COLONOSCOPY WITH PROPOFOL
Anesthesia: Monitor Anesthesia Care

## 2021-10-26 MED ORDER — PANTOPRAZOLE SODIUM 40 MG PO TBEC
40.0000 mg | DELAYED_RELEASE_TABLET | Freq: Two times a day (BID) | ORAL | Status: DC
  Administered 2021-10-26 – 2021-10-27 (×2): 40 mg via ORAL
  Filled 2021-10-26 (×2): qty 1

## 2021-10-26 MED ORDER — LIDOCAINE HCL 1 % IJ SOLN
5.0000 mL | Freq: Once | INTRAMUSCULAR | Status: AC
  Filled 2021-10-26 (×2): qty 5

## 2021-10-26 MED ORDER — LIDOCAINE HCL (PF) 1 % IJ SOLN
INTRAMUSCULAR | Status: AC
  Filled 2021-10-26: qty 5

## 2021-10-26 MED ORDER — PROPOFOL 500 MG/50ML IV EMUL
INTRAVENOUS | Status: DC | PRN
  Administered 2021-10-26: 150 ug/kg/min via INTRAVENOUS

## 2021-10-26 MED ORDER — METHYLNALTREXONE BROMIDE 12 MG/0.6ML ~~LOC~~ SOLN
12.0000 mg | Freq: Once | SUBCUTANEOUS | Status: AC
Start: 2021-10-26 — End: 2021-10-26
  Administered 2021-10-26: 12 mg via SUBCUTANEOUS
  Filled 2021-10-26: qty 0.6

## 2021-10-26 MED ORDER — LIDOCAINE 2% (20 MG/ML) 5 ML SYRINGE
INTRAMUSCULAR | Status: DC | PRN
  Administered 2021-10-26: 100 mg via INTRAVENOUS

## 2021-10-26 SURGICAL SUPPLY — 22 items

## 2021-10-26 NOTE — Anesthesia Preprocedure Evaluation (Signed)
Anesthesia Evaluation  Patient identified by MRN, date of birth, ID band Patient awake    Reviewed: Allergy & Precautions, H&P , NPO status , Patient's Chart, lab work & pertinent test results  Airway Mallampati: II  TM Distance: >3 FB Neck ROM: full    Dental no notable dental hx.    Pulmonary former smoker,    Pulmonary exam normal breath sounds clear to auscultation       Cardiovascular negative cardio ROS Normal cardiovascular exam Rhythm:regular Rate:Normal     Neuro/Psych  Headaches, PSYCHIATRIC DISORDERS Anxiety Depression Back pain. Takes oxycodone    GI/Hepatic GERD  ,  Endo/Other    Renal/GU      Musculoskeletal   Abdominal (+) + obese,   Peds  Hematology  (+) Blood dyscrasia, anemia ,   Anesthesia Other Findings   Reproductive/Obstetrics                             Anesthesia Physical  Anesthesia Plan  ASA: II  Anesthesia Plan: MAC   Post-op Pain Management: Minimal or no pain anticipated   Induction: Intravenous  PONV Risk Score and Plan: 2 and Treatment may vary due to age or medical condition and Propofol infusion  Airway Management Planned: Natural Airway  Additional Equipment:   Intra-op Plan:   Post-operative Plan:   Informed Consent: I have reviewed the patients History and Physical, chart, labs and discussed the procedure including the risks, benefits and alternatives for the proposed anesthesia with the patient or authorized representative who has indicated his/her understanding and acceptance.       Plan Discussed with: Anesthesiologist  Anesthesia Plan Comments:         Anesthesia Quick Evaluation

## 2021-10-26 NOTE — Anesthesia Postprocedure Evaluation (Signed)
Anesthesia Post Note  Patient: Kelsey Lynch  Procedure(s) Performed: COLONOSCOPY WITH PROPOFOL ESOPHAGOGASTRODUODENOSCOPY (EGD) BIOPSY     Patient location during evaluation: PACU Anesthesia Type: MAC Level of consciousness: awake and alert Pain management: pain level controlled Vital Signs Assessment: post-procedure vital signs reviewed and stable Respiratory status: spontaneous breathing, nonlabored ventilation and respiratory function stable Cardiovascular status: blood pressure returned to baseline and stable Postop Assessment: no apparent nausea or vomiting Anesthetic complications: no   No notable events documented.  Last Vitals:  Vitals:   10/26/21 1104 10/26/21 1151  BP: 137/83 140/90  Pulse: 63 65  Resp: 16 18  Temp:  36.8 C  SpO2: 98% 99%    Last Pain:  Vitals:   10/26/21 1159  TempSrc:   PainSc: [place]

## 2021-10-26 NOTE — Op Note (Signed)
[facility] Patient Name: Kelsey Lynch Procedure Date : 10/26/2021 MRN: [id number] Attending MD: Wilhemina Bonito. Marina Goodell , MD Date of Birth: [birth year] CSN: [id number] Age: 36 Admit Type: Inpatient Procedure:                Upper GI endoscopy with biopsies Indications:              Iron deficiency anemia, Heme positive stool Providers:                Wilhemina Bonito. Marina Goodell, MD, Fransisca Connors, Rozetta Nunnery, Technician Referring MD:             [facility] hospitalist Medicines:                Monitored Anesthesia Care Complications:            No immediate complications. Estimated Blood Loss:     Estimated blood loss: none. Procedure:                Pre-Anesthesia Assessment:                           - Prior to the procedure, a History and Physical                            was performed, and patient medications and                            allergies were reviewed. The patient's tolerance of                            previous anesthesia was also reviewed. The risks                            and benefits of the procedure and the sedation                            options and risks were discussed with the patient.                            All questions were answered, and informed consent                            was obtained. Prior Anticoagulants: The patient has                            taken no previous anticoagulant or antiplatelet                            agents. ASA Grade Assessment: II - A patient with                            mild systemic disease. After reviewing the risks  and benefits, the patient was deemed in                            satisfactory condition to undergo the procedure.                           After obtaining informed consent, the endoscope was                            passed under direct vision. Throughout the                            procedure, the patient's blood pressure, pulse, and                             oxygen saturations were monitored continuously. The                            [id number]-[id number] ([id number] Olympus endoscope was introduced                            through the mouth, and advanced to the second part                            of duodenum. The upper GI endoscopy was                            accomplished without difficulty. The patient                            tolerated the procedure well. Scope In: Scope Out: Findings:      The esophagus was normal.      The stomach had small volume retained gastric contents in the fundus.      There was circumferential ulceration of the pylorus with edema and       stenosis. This was friable but no high risk stigmata for bleeding.       Biopsies were taken with a cold forceps for histology, to rule out       Helicobacter pylori.      The examined duodenum beyond the pyloric ulceration was normal.      The cardia and gastric fundus were normal on retroflexion. Impression:               1. Pyloric ulceration with partial stenosis. This                            explains the iron deficiency and Hemoccult positive                            stool. The etiology is likely NSAIDs.                           2. Question concurrent hemoglobinopathy. Recommendation:           1. Resume previous diet  2. AVOID ALL NSAIDS (medications such as ibuprofen,                            Advil, Aleve, Naprosyn). For pain, Tylenol okay and                            recommended dosages.                           3. Please discharged on pantoprazole 40 mg twice                            daily. She should take this medication twice daily                            for 8 weeks then once daily thereafter. Make sure                            she has several refills                           4. We will arrange GI office follow-up in 4 to 6                            weeks                           5. Keep plans  for hematology evaluation                           6. Okay for discharge from GI perspective                           Discussed with patient. She was provided a copy of                            her endoscopy reports. We will sign off. Procedure Code(s):        --- Professional ---                           [phone], Esophagogastroduodenoscopy, flexible,                            transoral; with biopsy, single or multiple Diagnosis Code(s):        --- Professional ---                           K25.9, Gastric ulcer, unspecified as acute or                            chronic, without hemorrhage or perforation                           D50.9, Iron deficiency anemia, unspecified  R19.5, Other fecal abnormalities CPT copyright 2019 American Medical Association. All rights reserved. The codes documented in this report are preliminary and upon coder review may  be revised to meet current compliance requirements. Wilhemina Bonito. Marina Goodell, MD 10/26/2021 11:15:24 AM This report has been signed electronically. Number of Addenda: 0

## 2021-10-26 NOTE — Interval H&P Note (Signed)
History and Physical Interval Note:  10/26/2021 9:52 AM  Kelsey Lynch  has presented today for surgery, with the diagnosis of Profound iron deficiency anemia.  FOBT positive testing in setting of oral iron supplement.  No overt bleeding..  The various methods of treatment have been discussed with the patient and family. After consideration of risks, benefits and other options for treatment, the patient has consented to  Procedure(s): COLONOSCOPY WITH PROPOFOL (N/A) ESOPHAGOGASTRODUODENOSCOPY (EGD) (N/A) as a surgical intervention.  The patient's history has been reviewed, patient examined, no change in status, stable for surgery.  I have reviewed the patient's chart and labs.  Questions were answered to the patient's satisfaction.     Yancey Flemings

## 2021-10-26 NOTE — Telephone Encounter (Signed)
-----   Message from Hilarie Fredrickson, MD sent at 10/26/2021 12:30 PM EDT ----- Regarding: Outpatient follow-up Kelsey Lynch, This patient is in the hospital with gastric ulcer.  Should be discharged later today. Please arrange an outpatient visit with me in 4 to 6 weeks. Thanks Dr. Marina Goodell

## 2021-10-26 NOTE — Progress Notes (Addendum)
PROGRESS NOTE        PATIENT DETAILS Name: Kelsey Lynch Age: 36 y.o. Sex: female Date of Birth: 01-18-1986 Admit Date: 10/24/2021 Admitting Physician Toy Baker, MD GHW:[id number], Jannifer Rodney, MD  Brief Summary: Patient is a 36 y.o.  female with chronic pain syndrome-who was found to have iron deficiency anemia several months back-started on oral iron supplementation-presented to the hospital after she was found to have a hemoglobin of 5.8.  See below for further details.  Significant events: 6/26>> admit to [facility] for evaluation of severe anemia.  Significant studies: 6/27>> CXR: No PNA 6/27>> ESR: 34 6/27>> CRP: 8.6 6/27>> ANA: negative 6/27>> HBsAg/HCV Ab: Negative 6/27>> hemoglobin electrophoresis/fractionation: Pending  Significant microbiology data: None  Procedures: 6/28>> colonoscopy: Erosion in the ascending colon-likely NSAID related. 6/28>> EGD: Pyloric ulceration with partial stenosis  Consults: GI, oncology  Subjective: No chest pain or shortness of breath.  No change in painful lumpy lesion in bilateral lower extremities/bilateral forearms.  Objective: Vitals: Blood pressure 140/90, pulse 65, temperature 98.3 F (36.8 C), temperature source Axillary, resp. rate 18, height $RemoveBe'5\' 2"'PLHUQjrhG$  (1.575 m), weight 81.2 kg, SpO2 99 %, unknown if currently breastfeeding.   Exam: Gen Exam:Alert awake-not in any distress HEENT:atraumatic, normocephalic Chest: B/L clear to auscultation anteriorly CVS:S1S2 regular Abdomen:soft non tender, non distended Extremities:no edema Neurology: Non focal Skin: Tender nodular appearing erythematous lesions in bilateral lower extremities/bilateral forearms.  Pertinent Labs/Radiology:    Latest Ref Rng & Units 10/26/2021    6:47 AM 10/25/2021    2:07 PM 10/24/2021    8:30 PM  CBC  WBC 4.0 - 10.5 K/uL 5.1  6.6  8.3   Hemoglobin 12.0 - 15.0 g/dL 8.5  8.8  5.9   Hematocrit 36.0 - 46.0 % 28.6  29.5  22.0    Platelets 150 - 400 K/uL 349  PLATELET CLUMPS NOTED ON SMEAR, COUNT APPEARS ADEQUATE  341     Lab Results  Component Value Date   NA 140 10/25/2021   K 4.0 10/25/2021   CL 109 10/25/2021   CO2 22 10/25/2021      Assessment/Plan: Severe anemia due to iron deficiency anemia-likely due to slow/chronic GI bleeding from gastric ulcer: Hemoglobin now stable-after 3 units of PRBC-EGD results as above-continue PPI twice daily x8 weeks, and once daily thereafter.  Avoid all NSAIDs.  GI will arrange for posthospital discharge visit.  Do not think she has any significant hemoglobinopathy-although hemoglobin electrophoresis is still pending.  Painful bilateral erythematous nodules in legs/forearm: Possibly erythema nodosum-?  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  Will likely require outpatient referral for rheumatology/dermatology depending on biopsy results.  Chronic back pain/chronic pain syndrome: Continue usual dosing of Neurontin/narcotics.  History of anxiety: Continue BuSpar  Obesity: Estimated body mass index is 32.74 kg/m as calculated from the following:   Height as of this encounter: $RemoveBeforeD'5\' 2"'lBextWpftPOKCu$  (1.575 m).   Weight as of this encounter: 81.2 kg.   Code status:   Code Status: Full Code   DVT Prophylaxis: SCDs Start: 10/25/21 9678   Family Communication: None at bedside.   Disposition Plan: Status is: Inpatient Remains inpatient appropriate because: Skin biopsy pending-following which she possibly could be discharged home either today or tomorrow.   Planned Discharge Destination:Home  Diet: Diet Order             Diet regular Room service appropriate? Yes; Fluid consistency: Thin  Diet effective now                     Antimicrobial agents: Anti-infectives (From admission, onward)    Start      Dose/Rate Route Frequency Ordered Stop   10/25/21 1000  valACYclovir (VALTREX) tablet 500 mg        500 mg Oral Daily 10/25/21 0263          MEDICATIONS: Scheduled Meds:  sodium chloride   Intravenous Once   [START ON 10/30/2021] buprenorphine  2 patch Transdermal Weekly   busPIRone  15 mg Oral BID   folic acid  2 mg Oral Daily   gabapentin  600 mg Oral TID   pantoprazole  40 mg Oral Q1200   [START ON 10/27/2021] senna  1 tablet Oral QHS   sodium chloride flush  3 mL Intravenous Q12H   valACYclovir  500 mg Oral Daily   Continuous Infusions:  sodium chloride Stopped (10/25/21 0006)   sodium chloride     PRN Meds:.sodium chloride, acetaminophen **OR** acetaminophen, HYDROcodone-acetaminophen, ondansetron (ZOFRAN) IV, oxyCODONE, sodium chloride flush, tiZANidine   I have personally reviewed following labs and imaging studies  LABORATORY DATA: CBC: Recent Labs  Lab 10/24/21 2030 10/25/21 1407 10/26/21 0647  WBC 8.3 6.6 5.1  NEUTROABS 5.7  --  2.0  HGB 5.9* 8.8* 8.5*  HCT 22.0* 29.5* 28.6*  MCV 57.9* 62.9* 62.4*  PLT 341 PLATELET CLUMPS NOTED ON SMEAR, COUNT APPEARS ADEQUATE 349     Basic Metabolic Panel: Recent Labs  Lab 10/24/21 2030 10/25/21 0124 10/25/21 1407  NA 135  --  140  K 4.1  --  4.0  CL 107  --  109  CO2 21*  --  22  GLUCOSE 115*  --  113*  BUN 15  --  12  CREATININE 0.89  --  0.99  CALCIUM 8.1*  --  8.5*  MG  --  2.3  --   PHOS  --  3.6  --      GFR: Estimated Creatinine Clearance: 78.3 mL/min (by C-G formula based on SCr of 0.99 mg/dL).  Liver Function Tests: Recent Labs  Lab 10/24/21 2030 10/25/21 1407  AST 12* 11*  ALT 13 13  ALKPHOS 74 74  BILITOT 0.4 0.9  PROT 6.0* 6.0*  ALBUMIN 2.9* 2.9*    No results for input(s): "LIPASE", "AMYLASE" in the last 168 hours. No results for input(s): "AMMONIA" in the last 168 hours.  Coagulation Profile: Recent Labs  Lab 10/25/21 0124  INR 1.1     Cardiac Enzymes: Recent Labs  Lab  10/25/21 0124  CKTOTAL 55     BNP (last 3 results) No results for input(s): "PROBNP" in the last 8760 hours.  Lipid Profile: No results for input(s): "CHOL", "HDL", "LDLCALC", "TRIG", "CHOLHDL", "LDLDIRECT" in the last 72 hours.  Thyroid Function Tests: Recent Labs    10/25/21 0124  TSH 1.302     Anemia Panel: Recent Labs    10/24/21 2300 10/26/21 0647  VITAMINB12 341  --   FOLATE 6.0  --   FERRITIN 15  --   TIBC 407  --   IRON 17*  --   RETICCTPCT RESULTS UNAVAILABLE DUE TO INTERFERING SUBSTANCE 1.7     Urine analysis:    Component Value Date/Time   COLORURINE YELLOW 10/25/2021 0400  APPEARANCEUR HAZY (A) 10/25/2021 0400   LABSPEC 1.009 10/25/2021 0400   PHURINE 7.0 10/25/2021 0400   GLUCOSEU NEGATIVE 10/25/2021 0400   HGBUR NEGATIVE 10/25/2021 0400   BILIRUBINUR NEGATIVE 10/25/2021 0400   KETONESUR NEGATIVE 10/25/2021 0400   PROTEINUR NEGATIVE 10/25/2021 0400   UROBILINOGEN 0.2 02/27/2014 1210   NITRITE NEGATIVE 10/25/2021 0400   LEUKOCYTESUR SMALL (A) 10/25/2021 0400    Sepsis Labs: Lactic Acid, Venous    Component Value Date/Time   LATICACIDVEN 0.9 10/25/2021 0400    MICROBIOLOGY: No results found for this or any previous visit (from the past 240 hour(s)).  RADIOLOGY STUDIES/RESULTS: VAS [place] LOWER EXTREMITY VENOUS (DVT)  Result Date: 10/25/2021  Lower Venous DVT Study Patient Name:  [facility]  Date of Exam:   10/25/2021 Medical Rec #: [id number]        Accession #:    [id number] Date of Birth: 03-18-1986       Patient Gender: F Patient Age:   53 years Exam Location:  [facility] Procedure:      VAS [place] LOWER EXTREMITY VENOUS (DVT) Referring Phys: Nyoka Lint DOUTOVA --------------------------------------------------------------------------------  Indications: Edema.  Risk Factors: None identified. Limitations: Poor ultrasound/tissue interface and patient pain tolerance. Comparison Study: No prior studies. Performing Technologist: Oliver Hum RVT  Examination Guidelines: A complete evaluation includes B-mode imaging, spectral Doppler, color Doppler, and power Doppler as needed of all accessible portions of each vessel. Bilateral testing is considered an integral part of a complete examination. Limited examinations for reoccurring indications may be performed as noted. The reflux portion of the exam is performed with the patient in reverse Trendelenburg.  +---------+---------------+---------+-----------+----------+--------------+ RIGHT    CompressibilityPhasicitySpontaneityPropertiesThrombus Aging +---------+---------------+---------+-----------+----------+--------------+ CFV      Full           Yes      Yes                                 +---------+---------------+---------+-----------+----------+--------------+ SFJ      Full                                                        +---------+---------------+---------+-----------+----------+--------------+ FV Prox  Full                                                        +---------+---------------+---------+-----------+----------+--------------+ FV Mid   Full                                                        +---------+---------------+---------+-----------+----------+--------------+ FV DistalFull                                                        +---------+---------------+---------+-----------+----------+--------------+ PFV      Full                                                        +---------+---------------+---------+-----------+----------+--------------+  POP      Full           Yes      Yes                                 +---------+---------------+---------+-----------+----------+--------------+ PTV      Full                                                        +---------+---------------+---------+-----------+----------+--------------+ PERO     Full                                                         +---------+---------------+---------+-----------+----------+--------------+   +---------+---------------+---------+-----------+----------+--------------+ LEFT     CompressibilityPhasicitySpontaneityPropertiesThrombus Aging +---------+---------------+---------+-----------+----------+--------------+ CFV      Full           Yes      Yes                                 +---------+---------------+---------+-----------+----------+--------------+ SFJ      Full                                                        +---------+---------------+---------+-----------+----------+--------------+ FV Prox  Full                                                        +---------+---------------+---------+-----------+----------+--------------+ FV Mid   Full                                                        +---------+---------------+---------+-----------+----------+--------------+ FV DistalFull                                                        +---------+---------------+---------+-----------+----------+--------------+ PFV      Full                                                        +---------+---------------+---------+-----------+----------+--------------+ POP      Full           Yes      Yes                                 +---------+---------------+---------+-----------+----------+--------------+  PTV      Full                                                        +---------+---------------+---------+-----------+----------+--------------+ PERO     Full                                                        +---------+---------------+---------+-----------+----------+--------------+     Summary: RIGHT: - There is no evidence of deep vein thrombosis in the lower extremity.  - No cystic structure found in the popliteal fossa.  LEFT: - There is no evidence of deep vein thrombosis in the lower extremity.  - No cystic structure found in the popliteal fossa.   *See table(s) above for measurements and observations. Electronically signed by Harold Barban MD on 10/25/2021 at 10:48:49 PM.    Final    DG CHEST PORT 1 VIEW  Result Date: 10/25/2021 CLINICAL DATA:  Low serum hemoglobin. EXAM: PORTABLE CHEST 1 VIEW COMPARISON:  PA Lat 04/07/2009 FINDINGS: The heart size and mediastinal contours are within normal limits, but with the heart upper-normal in size compared to the prior exam. Both lungs are clear. The visualized skeletal structures are unremarkable. IMPRESSION: Interval increased borderline prominence of the heart silhouette since the prior study. No vascular congestion or other acute radiographic chest findings. Electronically Signed   By: Telford Nab M.D.   On: 10/25/2021 01:48     LOS: 1 day   Oren Binet, MD  [facility]    To contact the attending provider between 7A-7P or the covering provider during after hours 7P-7A, please log into the web site [email or web address] and access using universal [place] password for that web site. If you do not have the password, please call the hospital operator.  10/26/2021, 12:11 PM

## 2021-10-26 NOTE — Consult Note (Signed)
Oswaldo Conroy [place] [birth year]  [id number].    Requesting MD: Sloan Leiter, MD Chief Complaint/Reason for Consult: skin biopsy of nodular rash  HPI:  Ms. Elfida Teruel is a 36 y/o F with a PMH chronic pain, GERD, and iron deficiency anemia who presented to the hospital 10/24/21 with with cc painful rash and shortness of breath. She was seen in the outpatient office where her hgb was 5.8 and she was sent to the ED. She had heme+ stools. She underwent GI workup included EGD and colonoscopy where pyloric ulcer was noted as well as an erosion in the ascending colon. Heme/onc was consulted for microcytic anemia and is following. She says the rash started on Saturday (4 days ago) on her legs and then started on her forearms. Denies pruritis. Says it is painful to touch. Patient is a former smoker, drinks alcohol occasionally, and takes oxycodone daily for her chronic pain. Denies use of blood thinning medications.   ROS: Review of Systems  All other systems reviewed and are negative.   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 Her smoking use included cigarettes. She has never used smokeless tobacco. She reports that she does not currently use alcohol. She reports current drug use. Drug: Oxycodone.  Allergies:  Allergies  Allergen Reactions   Penicillins Hives and Anaphylaxis    Has patient had a PCN reaction  causing immediate rash, facial/tongue/throat swelling, SOB or lightheadedness with hypotension: Yes  Has patient had a PCN reaction causing severe rash involving mucus membranes or skin necrosis: Yes Has patient had a PCN reaction that required hospitalization No Has patient had a PCN reaction occurring within the last 10 years: Yes  If all of the above answers are "NO", then may proceed with Cephalosporin use.  Has patient had a PCN reaction causing immediate rash, facial/tongue/throat swelling, SOB or lightheadedness with hypotension: Yes  Has patient had a PCN reaction causing severe rash involving mucus membranes or skin necrosis: Yes Has patient had a PCN reaction that required hospitalization No Has patient had a PCN reaction occurring within the last 10 years: Yes  If all of the above answers are "NO", then may proceed with Cephalosporin use.    Shellfish Allergy Anaphylaxis and Swelling   Hydroxyzine Rash   Meloxicam Rash    Medications Prior to Admission  Medication Sig Dispense Refill   buprenorphine (BUTRANS) 20 MCG/HR PTWK Place 1 patch onto the skin once a week.     busPIRone (BUSPAR) 15 MG tablet Take 15 mg by mouth 2 (two) times daily.     ferrous sulfate 324 (65 Fe) MG TBEC Take 1 tablet by mouth in the morning, at noon, and at bedtime.     gabapentin (NEURONTIN) 600  MG tablet Take 600 mg by mouth in the morning, at noon, in the evening, and at bedtime.     medroxyPROGESTERone (DEPO-PROVERA) 150 MG/ML injection Inject 150 mg into the muscle every 3 (three) months.     oxyCODONE (ROXICODONE) 15 MG immediate release tablet Take 15 mg by mouth every 6 (six) hours as needed.     tiZANidine (ZANAFLEX) 4 MG tablet Take 4 mg by mouth in the morning, at noon, in the evening, and at bedtime.     valACYclovir (VALTREX) 500 MG tablet Take 500 mg by mouth daily.     Vitamin D, Ergocalciferol, (DRISDOL) 1.25 MG (50000 UNIT) CAPS capsule Take 50,000 Units by mouth once a week.      famotidine (PEPCID) 40 MG tablet Take by mouth.     naloxone (NARCAN) nasal spray 4 mg/0.1 mL Place 1 spray into the nose once.     oxyCODONE (OXY IR/ROXICODONE) 5 MG immediate release tablet Take 1 tablet (5 mg total) by mouth every 4 (four) hours as needed (pain scale 4-7). (Patient not taking: Reported on 10/24/2021) 30 tablet 0     Physical Exam: Blood pressure 140/90, pulse 65, temperature 98.3 F (36.8 C), temperature source Axillary, resp. rate 18, height 5\' 2"  (1.575 m), weight 81.2 kg, SpO2 99 %, unknown if currently breastfeeding. General: Pleasant female sitting on hospital bed, appears stated age, NAD. HEENT: head -normocephalic, atraumatic; Eyes: PERRLA, no conjunctival injection, anicteric sclerae  Neck- Trachea is midline, no thyromegaly or JVD appreciated.  CV- RRR, normal S1/S2, no M/R/G, radial and dorsalis pedis pulses 2+ BL, mild pedal edema which she says started after rash. Pulm- breathing is non-labored. CTABL, no wheezes, rhales, rhonchi. Abd- soft, NT/ND, appropriate bowel sounds in 4 quadrants, no masses, hernias, or organomegaly. GU- deferred  MSK- UE/LE symmetrical, no cyanosis, clubbing, or edema. Neuro- CN II-XII grossly in tact, no paresthesias. Psych- Alert and Oriented x3 with appropriate affect Skin: warm and dry, there is an erythematous, nodular rash that is TTP over the bilateral lower extremities and bilateral forearms (see images under media)   Results for orders placed or performed during the hospital encounter of 10/24/21 (from the past 48 hour(s))  Comprehensive metabolic panel     Status: Abnormal   Collection Time: 10/24/21  8:30 PM  Result Value Ref Range   Sodium 135 135 - 145 mmol/L   Potassium 4.1 3.5 - 5.1 mmol/L   Chloride 107 98 - 111 mmol/L   CO2 21 (L) 22 - 32 mmol/L   Glucose, Bld 115 (H) 70 - 99 mg/dL    Comment: Glucose reference range applies only to samples taken after fasting for at least 8 hours.   BUN 15 6 - 20 mg/dL    Creatinine, Ser 0.89 0.44 - 1.00 mg/dL   Calcium 8.1 (L) 8.9 - 10.3 mg/dL   Total Protein 6.0 (L) 6.5 - 8.1 g/dL   Albumin 2.9 (L) 3.5 - 5.0 g/dL   AST 12 (L) 15 - 41 U/L   ALT 13 0 - 44 U/L   Alkaline Phosphatase 74 38 - 126 U/L   Total Bilirubin 0.4 0.3 - 1.2 mg/dL   GFR, Estimated >60 >60 mL/min    Comment: (NOTE) Calculated using the CKD-EPI Creatinine Equation (2021)    Anion gap 7 5 - 15    Comment: Performed at [facility], [place] [address], [place], [place] [address]  CBC with Differential     Status: Abnormal   Collection  Time: 10/24/21  8:30 PM  Result Value Ref Range   WBC 8.3 4.0 - 10.5 K/uL   RBC 3.80 (L) 3.87 - 5.11 MIL/uL   Hemoglobin 5.9 (LL) 12.0 - 15.0 g/dL    Comment: REPEATED TO VERIFY Reticulocyte Hemoglobin testing may be clinically indicated, consider ordering this additional test [id number] THIS CRITICAL RESULT HAS VERIFIED AND BEEN CALLED TO ANelva Nay, RN BY MONICA RIVET ON 06 26 2023 AT 2112, AND HAS BEEN READ BACK.     HCT 22.0 (L) 36.0 - 46.0 %   MCV 57.9 (L) 80.0 - 100.0 fL   MCH 15.5 (L) 26.0 - 34.0 pg   MCHC 26.8 (L) 30.0 - 36.0 g/dL   RDW 23.8 (H) 11.5 - 15.5 %   Platelets 341 150 - 400 K/uL   nRBC 0.4 (H) 0.0 - 0.2 %   Neutrophils Relative % 69 %   Neutro Abs 5.7 1.7 - 7.7 K/uL   Lymphocytes Relative 21 %   Lymphs Abs 1.7 0.7 - 4.0 K/uL   Monocytes Relative 6 %   Monocytes Absolute 0.5 0.1 - 1.0 K/uL   Eosinophils Relative 3 %   Eosinophils Absolute 0.3 0.0 - 0.5 K/uL   Basophils Relative 1 %   Basophils Absolute 0.0 0.0 - 0.1 K/uL   WBC Morphology MORPHOLOGY UNREMARKABLE    Smear Review Normal platelet morphology    Immature Granulocytes 0 %   Abs Immature Granulocytes 0.03 0.00 - 0.07 K/uL   Schistocytes PRESENT    Tear Drop Cells PRESENT    Polychromasia PRESENT    Ovalocytes PRESENT     Comment: Performed at [facility], [address] [place], [place] [address]  Type and screen [place]      Status: None   Collection Time: 10/24/21  8:30 PM  Result Value Ref Range   ABO/RH(D) B POS    Antibody Screen NEG    Sample Expiration 10/27/2021,2359    Unit Number [id number]    Blood Component Type RED CELLS,LR    Unit division 00    Status of Unit ISSUED,FINAL    Transfusion Status OK TO TRANSFUSE    Crossmatch Result Compatible    Unit Number [id number]    Blood Component Type RED CELLS,LR    Unit division 00    Status of Unit ISSUED,FINAL    Transfusion Status OK TO TRANSFUSE    Crossmatch Result Compatible    Unit Number [id number]    Blood Component Type RED CELLS,LR    Unit division 00    Status of Unit ISSUED,FINAL    Transfusion Status OK TO TRANSFUSE    Crossmatch Result      Compatible Performed at [facility], [place] [address] [place], [place] [address]   Pathologist smear review     Status: None   Collection Time: 10/24/21  8:30 PM  Result Value Ref Range   Path Review      Marked anisocytosis, hypochromia and poikilocytosis of erythrocytes.    Comment: A few fragmented erythrocytes and schistocytes are present. R/O hemolysis. Leukocytes and platelets are normal. Reviewed by Unknown Jim, M.D. 10/25/2021 Performed at [facility], [place] [address] [place], [place] [address]   I-Stat beta hCG blood, ED     Status: None   Collection Time: 10/24/21  9:17 PM  Result Value Ref Range   I-stat hCG, quantitative <5.0 <5 mIU/mL   Comment 3  Comment:   GEST. AGE      CONC.  (mIU/mL)   <=1 WEEK        5 - 50     2 WEEKS       50 - 500     3 WEEKS       100 - 10,000     4 WEEKS     1,000 - 30,000        FEMALE AND NON-PREGNANT FEMALE:     LESS THAN 5 mIU/mL   Vitamin B12     Status: None   Collection Time: 10/24/21 11:00 PM  Result Value Ref Range   Vitamin B-12 341 180 - 914 pg/mL    Comment: (NOTE) This assay is not validated for testing neonatal or myeloproliferative syndrome specimens for Vitamin B12 levels. Performed at  [facility], [place] [address] [place], [place] [address]   Folate     Status: None   Collection Time: 10/24/21 11:00 PM  Result Value Ref Range   Folate 6.0 >5.9 ng/mL    Comment: Performed at [facility], [place] [address], [place], [place] [address]  Iron and TIBC     Status: Abnormal   Collection Time: 10/24/21 11:00 PM  Result Value Ref Range   Iron 17 (L) 28 - 170 ug/dL   TIBC 407 250 - 450 ug/dL   Saturation Ratios 4 (L) 10.4 - 31.8 %   UIBC 390 ug/dL    Comment: Performed at [facility], [place] [address], [place], [place] [address]  Ferritin     Status: None   Collection Time: 10/24/21 11:00 PM  Result Value Ref Range   Ferritin 15 11 - 307 ng/mL    Comment: Performed at [facility], [place] [address] [place], [place] [address]  Reticulocytes     Status: None   Collection Time: 10/24/21 11:00 PM  Result Value Ref Range   Retic Ct Pct RESULTS UNAVAILABLE DUE TO INTERFERING SUBSTANCE 0.4 - 3.1 %   RBC. 4.10 3.87 - 5.11 MIL/uL   Retic Count, Absolute NOT CALCULATED 19.0 - 186.0 K/uL    Comment: CORRECTED RESULTS CALLED TO: [place], [place] 06.27.23 M.RIVET CORRECTED ON 06/27 AT 0041: PREVIOUSLY REPORTED AS 96.8    Immature Retic Fract RESULTS UNAVAILABLE DUE TO INTERFERING SUBSTANCE 2.3 - 15.9 %    Comment: CORRECTED RESULTS CALLED TO: [place], [place] 06.27.23 M.RIVET Performed at [facility], [place] [address], [place], [place] [address] CORRECTED ON 06/27 AT 0041: PREVIOUSLY REPORTED AS 24.3   Prepare RBC (crossmatch)     Status: None   Collection Time: 10/24/21 11:00 PM  Result Value Ref Range   Order Confirmation      ORDER PROCESSED BY BLOOD BANK Performed at [facility], [place] [address] [place], [place] [address]   D-dimer, quantitative     Status: Abnormal   Collection Time: 10/24/21 11:00 PM  Result Value Ref Range   D-Dimer, Quant 0.84 (H) 0.00 - 0.50 ug/mL-FEU    Comment: (NOTE) At the manufacturer cut-off value of 0.5  g/mL FEU, this assay has a negative predictive value of 95-100%.This assay is intended for use in conjunction with a clinical pretest probability (PTP) assessment model to exclude pulmonary embolism (PE) and deep venous thrombosis (DVT) in outpatients suspected of PE or DVT. Results should be correlated with clinical presentation. Performed at [facility], [place] [address], [place], [place] [address]  Save Smear for Provider Slide Review     Status: None   Collection Time: 10/25/21  1:24 AM  Result Value Ref Range   Smear Review SMEAR STAINED AND AVAILABLE FOR REVIEW     Comment: Performed at [facility], [address] [place], [place] [address]  Sedimentation rate     Status: Abnormal   Collection Time: 10/25/21  1:24 AM  Result Value Ref Range   Sed Rate 34 (H) 0 - 22 mm/hr    Comment: Performed at [facility], [address], [place], [place] [address]  C-reactive protein     Status: Abnormal   Collection Time: 10/25/21  1:24 AM  Result Value Ref Range   CRP 8.6 (H) <1.0 mg/dL    Comment: Performed at [facility], [address], [place], [place] [address]  ANA w/Reflex if Positive     Status: None   Collection Time: 10/25/21  1:24 AM  Result Value Ref Range   Anti Nuclear Antibody (ANA) Negative Negative    Comment: (NOTE) Performed At: [facility] [address] [place], [place] [id number] Jolene Schimke MD [id number]   APTT     Status: None   Collection Time: 10/25/21  1:24 AM  Result Value Ref Range   aPTT 36 24 - 36 seconds    Comment: Performed at [facility], [address], [place], [place] [address]  Protime-INR     Status: None   Collection Time: 10/25/21  1:24 AM  Result Value Ref Range   Prothrombin Time 14.6 11.4 - 15.2 seconds   INR 1.1 0.8 - 1.2    Comment: (NOTE) INR goal varies based on device and disease states. Performed at [facility], [address], [place], [place] [address]   Lactate  dehydrogenase     Status: None   Collection Time: 10/25/21  1:24 AM  Result Value Ref Range   LDH 162 98 - 192 U/L    Comment: Performed at [facility], [address], [place], [place] [address]  CK     Status: None   Collection Time: 10/25/21  1:24 AM  Result Value Ref Range   Total CK 55 38 - 234 U/L    Comment: Performed at [facility], [address], [place], [place] [address]  Prealbumin     Status: Abnormal   Collection Time: 10/25/21  1:24 AM  Result Value Ref Range   Prealbumin 9.4 (L) 18 - 38 mg/dL    Comment: Performed at [facility], [address] [place], [place] [address]  TSH     Status: None   Collection Time: 10/25/21  1:24 AM  Result Value Ref Range   TSH 1.302 0.350 - 4.500 uIU/mL    Comment: Performed by a 3rd Generation assay with a functional sensitivity of <=0.01 uIU/mL. Performed at [facility], [address], [place], [place] [address]   Phosphorus     Status: None   Collection Time: 10/25/21  1:24 AM  Result Value Ref Range   Phosphorus 3.6 2.5 - 4.6 mg/dL    Comment: Performed at [facility], [address] [place], [place] [address]  Magnesium     Status: None   Collection Time: 10/25/21  1:24 AM  Result Value Ref Range   Magnesium 2.3 1.7 - 2.4 mg/dL    Comment: Performed at [facility], [address] [place], [place] [address]  Urinalysis, Complete w Microscopic Urine, Clean Catch     Status: Abnormal   Collection Time: 10/25/21  4:00 AM  Result Value Ref Range   Color, Urine YELLOW YELLOW   APPearance HAZY (A) CLEAR   Specific Gravity, Urine 1.009 1.005 - 1.030   pH 7.0 5.0 - 8.0   Glucose, UA NEGATIVE NEGATIVE mg/dL   Hgb urine dipstick NEGATIVE NEGATIVE   Bilirubin Urine NEGATIVE NEGATIVE   Ketones, ur NEGATIVE NEGATIVE mg/dL   Protein, ur NEGATIVE NEGATIVE mg/dL   Nitrite NEGATIVE NEGATIVE   Leukocytes,Ua SMALL (A) NEGATIVE   RBC / HPF 0-5 0 - 5 RBC/hpf   WBC, UA 6-10 0 - 5 WBC/hpf    Bacteria, UA MANY (A) NONE SEEN   Squamous Epithelial / LPF 0-5 0 - 5    Comment: Performed at [facility], [address] [place], [place] [address]  Lactic acid, plasma     Status: None   Collection Time: 10/25/21  4:00 AM  Result Value Ref Range   Lactic Acid, Venous 0.9 0.5 - 1.9 mmol/L    Comment: Performed at [facility], [address] [place] [address], [place], [place] [address]  HIV Antibody (routine testing w rflx)     Status: None   Collection Time: 10/25/21  2:07 PM  Result Value Ref Range   HIV Screen 4th Generation wRfx Non Reactive Non Reactive    Comment: Performed at [facility], [address] [place], [place] [address]  Comprehensive metabolic panel     Status: Abnormal   Collection Time: 10/25/21  2:07 PM  Result Value Ref Range   Sodium 140 135 - 145 mmol/L   Potassium 4.0 3.5 - 5.1 mmol/L   Chloride 109 98 - 111 mmol/L   CO2 22 22 - 32 mmol/L   Glucose, Bld 113 (H) 70 - 99 mg/dL    Comment: Glucose reference range applies only to samples taken after fasting for at least 8 hours.   BUN 12 6 - 20 mg/dL   Creatinine, Ser 7.78 0.44 - 1.00 mg/dL   Calcium 8.5 (L) 8.9 - 10.3 mg/dL   Total Protein 6.0 (L) 6.5 - 8.1 g/dL   Albumin 2.9 (L) 3.5 - 5.0 g/dL   AST 11 (L) 15 - 41 U/L   ALT 13 0 - 44 U/L   Alkaline Phosphatase 74 38 - 126 U/L   Total Bilirubin 0.9 0.3 - 1.2 mg/dL   GFR, Estimated >24 >23 mL/min    Comment: (NOTE) Calculated using the CKD-EPI Creatinine Equation (2021)    Anion gap 9 5 - 15    Comment: Performed at [facility], [address] [place], [place] [address]  Hepatitis C antibody     Status: None   Collection Time: 10/25/21  2:07 PM  Result Value Ref Range   HCV Ab NON REACTIVE NON REACTIVE    Comment: (NOTE) Nonreactive HCV antibody screen is consistent with no HCV infections,  unless recent infection is suspected or other evidence exists to indicate HCV infection.  Performed at [facility], [address],  [place], [place] [address]   Hepatitis B surface antibody,qualitative     Status: None   Collection Time: 10/25/21  2:07 PM  Result Value Ref Range   Hep B S Ab NON REACTIVE NON REACTIVE    Comment: (NOTE) Inconsistent with immunity, less than 10 mIU/mL.  Performed at [facility], [place] [address] [place], [place] [address]   Hepatitis B core antibody, total     Status: None   Collection Time: 10/25/21  2:07 PM  Result Value Ref Range   Hep B Core Total Ab NON REACTIVE NON REACTIVE    Comment: Performed at [place] [address] [place], [place] [address]  CBC     Status: Abnormal   Collection Time: 10/25/21  2:07 PM  Result Value Ref Range   WBC 6.6 4.0 - 10.5 K/uL   RBC 4.69 3.87 - 5.11 MIL/uL   Hemoglobin 8.8 (L) 12.0 - 15.0 g/dL    Comment: Reticulocyte Hemoglobin testing may be clinically indicated, consider ordering this additional test [id number] REPEATED TO VERIFY POST TRANSFUSION SPECIMEN    HCT 29.5 (L) 36.0 - 46.0 %   MCV 62.9 (L) 80.0 - 100.0 fL    Comment: POST TRANSFUSION SPECIMEN REPEATED TO VERIFY    MCH 18.8 (L) 26.0 - 34.0 pg   MCHC 29.8 (L) 30.0 - 36.0 g/dL   RDW 29.2 (H) 11.5 - 15.5 %   Platelets  150 - 400 K/uL    PLATELET CLUMPS NOTED ON SMEAR, COUNT APPEARS ADEQUATE   nRBC 1.4 (H) 0.0 - 0.2 %    Comment: Performed at [facility], [place] [address], [place], [place] [address]  Hepatitis B surface antigen     Status: None   Collection Time: 10/25/21  2:07 PM  Result Value Ref Range   Hepatitis B Surface Ag NON REACTIVE NON REACTIVE    Comment: Performed at [place] [address], [place], [place] [address]  CBC with Differential/Platelet     Status: Abnormal   Collection Time: 10/26/21  6:47 AM  Result Value Ref Range   WBC 5.1 4.0 - 10.5 K/uL   RBC 4.58 3.87 - 5.11 MIL/uL   Hemoglobin 8.5 (L) 12.0 - 15.0 g/dL    Comment: Reticulocyte Hemoglobin testing may be clinically indicated, consider ordering this  additional test [id number]    HCT 28.6 (L) 36.0 - 46.0 %   MCV 62.4 (L) 80.0 - 100.0 fL   MCH 18.6 (L) 26.0 - 34.0 pg   MCHC 29.7 (L) 30.0 - 36.0 g/dL   RDW 29.1 (H) 11.5 - 15.5 %   Platelets 349 150 - 400 K/uL    Comment: REPEATED TO VERIFY   nRBC 1.0 (H) 0.0 - 0.2 %   Neutrophils Relative % 39 %   Neutro Abs 2.0 1.7 - 7.7 K/uL   Lymphocytes Relative 38 %   Lymphs Abs 1.9 0.7 - 4.0 K/uL   Monocytes Relative 12 %   Monocytes Absolute 0.6 0.1 - 1.0 K/uL   Eosinophils Relative 9 %   Eosinophils Absolute 0.5 0.0 - 0.5 K/uL   Basophils Relative 1 %   Basophils Absolute 0.1 0.0 - 0.1 K/uL   Immature Granulocytes 1 %   Abs Immature Granulocytes 0.04 0.00 - 0.07 K/uL    Comment: Performed at [facility], [address] [place], [place] [address]  Reticulocytes     Status: Abnormal   Collection Time: 10/26/21  6:47 AM  Result Value Ref Range   Retic Ct Pct 1.7 0.4 - 3.1 %   RBC. 4.68 3.87 - 5.11 MIL/uL   Retic Count, Absolute 77.2 19.0 - 186.0 K/uL   Immature Retic Fract 38.4 (H) 2.3 - 15.9 %    Comment: Performed at [facility]  Lab, [address] [place], [place] [address]   VAS [place] LOWER EXTREMITY VENOUS (DVT)  Result Date: 10/25/2021  Lower Venous DVT Study Patient Name:  [facility]  Date of Exam:   10/25/2021 Medical Rec #: [id number]        Accession #:    [id number] Date of Birth: 07-23-1985       Patient Gender: F Patient Age:   23 years Exam Location:  [facility] Procedure:      VAS [place] LOWER EXTREMITY VENOUS (DVT) Referring Phys: Nyoka Lint DOUTOVA --------------------------------------------------------------------------------  Indications: Edema.  Risk Factors: None identified. Limitations: Poor ultrasound/tissue interface and patient pain tolerance. Comparison Study: No prior studies. Performing Technologist: Oliver Hum RVT  Examination Guidelines: A complete evaluation includes B-mode imaging, spectral Doppler, color Doppler, and power Doppler as  needed of all accessible portions of each vessel. Bilateral testing is considered an integral part of a complete examination. Limited examinations for reoccurring indications may be performed as noted. The reflux portion of the exam is performed with the patient in reverse Trendelenburg.  +---------+---------------+---------+-----------+----------+--------------+ RIGHT    CompressibilityPhasicitySpontaneityPropertiesThrombus Aging +---------+---------------+---------+-----------+----------+--------------+ CFV      Full           Yes      Yes                                 +---------+---------------+---------+-----------+----------+--------------+ SFJ      Full                                                        +---------+---------------+---------+-----------+----------+--------------+ FV Prox  Full                                                        +---------+---------------+---------+-----------+----------+--------------+ FV Mid   Full                                                        +---------+---------------+---------+-----------+----------+--------------+ FV DistalFull                                                        +---------+---------------+---------+-----------+----------+--------------+ PFV      Full                                                        +---------+---------------+---------+-----------+----------+--------------+ POP      Full           Yes      Yes                                 +---------+---------------+---------+-----------+----------+--------------+  PTV      Full                                                        +---------+---------------+---------+-----------+----------+--------------+ PERO     Full                                                        +---------+---------------+---------+-----------+----------+--------------+    +---------+---------------+---------+-----------+----------+--------------+ LEFT     CompressibilityPhasicitySpontaneityPropertiesThrombus Aging +---------+---------------+---------+-----------+----------+--------------+ CFV      Full           Yes      Yes                                 +---------+---------------+---------+-----------+----------+--------------+ SFJ      Full                                                        +---------+---------------+---------+-----------+----------+--------------+ FV Prox  Full                                                        +---------+---------------+---------+-----------+----------+--------------+ FV Mid   Full                                                        +---------+---------------+---------+-----------+----------+--------------+ FV DistalFull                                                        +---------+---------------+---------+-----------+----------+--------------+ PFV      Full                                                        +---------+---------------+---------+-----------+----------+--------------+ POP      Full           Yes      Yes                                 +---------+---------------+---------+-----------+----------+--------------+ PTV      Full                                                        +---------+---------------+---------+-----------+----------+--------------+  PERO     Full                                                        +---------+---------------+---------+-----------+----------+--------------+     Summary: RIGHT: - There is no evidence of deep vein thrombosis in the lower extremity.  - No cystic structure found in the popliteal fossa.  LEFT: - There is no evidence of deep vein thrombosis in the lower extremity.  - No cystic structure found in the popliteal fossa.  *See table(s) above for measurements and observations. Electronically signed  by Harold Barban MD on 10/25/2021 at 10:48:49 PM.    Final    DG CHEST PORT 1 VIEW  Result Date: 10/25/2021 CLINICAL DATA:  Low serum hemoglobin. EXAM: PORTABLE CHEST 1 VIEW COMPARISON:  PA Lat 04/07/2009 FINDINGS: The heart size and mediastinal contours are within normal limits, but with the heart upper-normal in size compared to the prior exam. Both lungs are clear. The visualized skeletal structures are unremarkable. IMPRESSION: Interval increased borderline prominence of the heart silhouette since the prior study. No vascular congestion or other acute radiographic chest findings. Electronically Signed   By: Telford Nab M.D.   On: 10/25/2021 01:48      Assessment/Plan Bilateral erythematous nodules of upper and lower extremities 36 y/o F admitted with symptomatic anemia which was worked up and is likely due to an oozing gastric ulcer. She is now hemodynamically stable and general surgery is asked to consult for skin biopsy. Patient agreeable to procedure-I discussed the risk of the procedure including, but not limited to, bleeding and infection. Discussed the possibility that pathology results will be non-diagnostic. I have several urgent consults pending but will plan to perform biopsy later today vs early tomorrow; as soon as I can.  Please see separate procedure note for details. Biopsy will be sent to the lab for pathology.    Severe anemia due UGIB, now stable Chronic pain anxiety   I reviewed Consultant GI notes, hospitalist notes, last 24 h vitals and pain scores, last 48 h intake and output, last 24 h labs and trends, and last 24 h imaging results.  Jill Alexanders, PA-C [facility] 10/26/2021, 2:45 PM Please see Amion for pager number during day hours 7:00am-4:30pm or 7:00am -11:30am on weekends

## 2021-10-26 NOTE — Op Note (Signed)
[facility] Patient Name: Kelsey Lynch Procedure Date : 10/26/2021 MRN: [id number] Attending MD: Wilhemina Bonito. Marina Goodell , MD Date of Birth: 1985-09-11 CSN: [id number] Age: 36 Admit Type: Inpatient Procedure:                Colonoscopy with biopsies Indications:              Heme positive stool, Iron deficiency anemia Providers:                Wilhemina Bonito. Marina Goodell, MD, Fransisca Connors, Rozetta Nunnery, Technician Referring MD:             [facility] hospitalist Medicines:                Monitored Anesthesia Care Complications:            No immediate complications. Estimated blood loss:                            None. Estimated Blood Loss:     Estimated blood loss: none. Procedure:                Pre-Anesthesia Assessment:                           - Prior to the procedure, a History and Physical                            was performed, and patient medications and                            allergies were reviewed. The patient's tolerance of                            previous anesthesia was also reviewed. The risks                            and benefits of the procedure and the sedation                            options and risks were discussed with the patient.                            All questions were answered, and informed consent                            was obtained. Prior Anticoagulants: The patient has                            taken no previous anticoagulant or antiplatelet                            agents. ASA Grade Assessment: II - A patient with  mild systemic disease. After reviewing the risks                            and benefits, the patient was deemed in                            satisfactory condition to undergo the procedure.                           After obtaining informed consent, the colonoscope                            was passed under direct vision. Throughout the                             procedure, the patient's blood pressure, pulse, and                            oxygen saturations were monitored continuously. The                            CF-HQ190L ([id number] Olympus coloscope was                            introduced through the anus and advanced to the the                            cecum, identified by appendiceal orifice and                            ileocecal valve. The terminal ileum, ileocecal                            valve, appendiceal orifice, and rectum were                            photographed. The quality of the bowel preparation                            was excellent. The colonoscopy was performed                            without difficulty. The patient tolerated the                            procedure well. The bowel preparation used was                            MoviPrep via split dose instruction. Scope In: 10:07:21 AM Scope Out: 10:23:18 AM Scope Withdrawal Time: 0 hours 13 minutes 20 seconds  Total Procedure Duration: 0 hours 15 minutes 57 seconds  Findings:      The terminal ileum appeared normal.      A single erosion was found in the ascending colon. Biopsies were taken  with a cold forceps for histology.      The exam was otherwise without abnormality on direct and retroflexion       views. Impression:               - The examined portion of the ileum was normal.                           - A single erosion (most consistent with benign                            NSAID related erosion) in the ascending colon.                            Biopsied.                           - The examination was otherwise normal on direct                            and retroflexion views. Recommendation:           - Repeat colonoscopy at age 89 for screening                            purposes is recommended.                           - Patient has a contact number available for                            emergencies. The signs and symptoms of  potential                            delayed complications were discussed with the                            patient. Return to normal activities tomorrow.                            Written discharge instructions were provided to the                            patient.                           - Resume previous diet.                           - Continue present medications.                           - Await pathology results.                           - EGD today. Please see report regarding findings  and final recommendations Procedure Code(s):        --- Professional ---                           [phone], Colonoscopy, flexible; with biopsy, single                            or multiple Diagnosis Code(s):        --- Professional ---                           K63.3, Ulcer of intestine                           R19.5, Other fecal abnormalities                           D50.9, Iron deficiency anemia, unspecified CPT copyright 2019 [employer]. All rights reserved. The codes documented in this report are preliminary and upon coder review may  be revised to meet current compliance requirements. Wilhemina Bonito. Marina Goodell, MD 10/26/2021 11:04:40 AM This report has been signed electronically. Number of Addenda: 0

## 2021-10-26 NOTE — Telephone Encounter (Signed)
Pt scheduled to see Dr. Marina Goodell 11/23/21@3 :40pm.

## 2021-10-26 NOTE — Procedures (Addendum)
Skin Biopsy Procedure Note   Procedure: Punch biopsy   Pre-operative Diagnosis: Rash   Post-procedure Diagnosis: same   Indications: rash of unknown etiology   Anesthesia: lidocaine 1%   Procedure Details  The procedure, risks and complications have been discussed in detail (including, but not limited to pain, infection, bleeding) with the patient, and the patient wishes to proceed with the procedure. The skin was sterilely prepped over the affected area in the usual fashion. Using a 9mm punch a skin biopsy was taken from rash on LEFT  forearm and RIGHT lower leg. Both specimens were placed in a single formalin specimen cup. Pressure held until hemostasis reached. Dry dressing applied. The patient was observed until stable. There were no complications, and the patient tolerated the procedure well.  Plan:  - 36mm punch biopsy x2 performed and sent for pathology. Leave dressing in place x24 hours. Ok to English as a second language teacher. Change dressing PRN for saturation or contamination. If sanguinous oozing occurs then hold direct pressure for 15 minutes, if it does not resolve then page general surgery at number listed on amion.    Hosie Spangle, PA-C [facility] Please see Amion for pager number during day hours 7:00am-4:30pm

## 2021-10-26 NOTE — Anesthesia Procedure Notes (Signed)
Procedure Name: MAC Date/Time: 10/26/2021 10:03 AM  Performed by: Valda Favia, CRNAPre-anesthesia Checklist: Patient identified, Emergency Drugs available, Suction available, Patient being monitored and Timeout performed Oxygen Delivery Method: Nasal cannula Airway Equipment and Method: Bite block Placement Confirmation: positive ETCO2 Dental Injury: Teeth and Oropharynx as per pre-operative assessment

## 2021-10-26 NOTE — Consult Note (Incomplete)
Kelsey Lynch [birth year]  [id number].    Requesting MD: Sloan Leiter, MD Chief Complaint/Reason for Consult: skin biopsy of nodular rash  HPI:  [name] is a 36 y/o F with a PMH chronic pain, GERD, and iron deficiency anemia who presented to the hospital 10/24/21 with with cc painful rash and shortness of breath. In the ED her hgb was 5.8 and she had heme+ stools. She underwent GI workup included EGD and colonoscopy where pyloric ulcer was noted as well as an erosion in the ascending colon. Heme/onc was consulted for microcytic anemia and is following. Patient is a former smoker, drinks alcohol occasionally, and takes oxycodone daily due to her chronic pain. Denies use of blood thinning medications.   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 Her smoking use included cigarettes. She has never used smokeless tobacco. She reports that she does not currently use alcohol. She reports current drug use. Drug: Oxycodone.  Allergies:  Allergies  Allergen Reactions   Penicillins Hives and Anaphylaxis    Has patient had a PCN reaction causing immediate rash, facial/tongue/throat swelling, SOB or lightheadedness with hypotension: Yes  Has patient had a PCN reaction causing severe rash involving mucus membranes or skin necrosis: Yes Has patient had a PCN reaction that required hospitalization  No Has patient had a PCN reaction occurring within the last 10 years: Yes  If all of the above answers are "NO", then may proceed with Cephalosporin use.  Has patient had a PCN reaction causing immediate rash, facial/tongue/throat swelling, SOB or lightheadedness with hypotension: Yes  Has patient had a PCN reaction causing severe rash involving mucus membranes or skin necrosis: Yes Has patient had a PCN reaction that required hospitalization No Has patient had a PCN reaction occurring within the last 10 years: Yes  If all of the above answers are "NO", then may proceed with Cephalosporin use.    Shellfish Allergy Anaphylaxis and Swelling   Hydroxyzine Rash   Meloxicam Rash    Medications Prior to Admission  Medication Sig Dispense Refill   buprenorphine (BUTRANS) 20 MCG/HR PTWK Place 1 patch onto the skin once a week.     busPIRone (BUSPAR) 15 MG tablet Take 15 mg by mouth 2 (two) times daily.     ferrous sulfate 324 (65 Fe) MG TBEC Take 1 tablet by mouth in the morning, at noon, and at bedtime.     gabapentin (NEURONTIN) 600 MG tablet Take 600 mg by mouth in the morning, at noon, in the evening, and at bedtime.     medroxyPROGESTERone (DEPO-PROVERA) 150 MG/ML injection Inject 150 mg into the muscle every 3 (three) months.     oxyCODONE (ROXICODONE) 15 MG immediate release tablet  Take 15 mg by mouth every 6 (six) hours as needed.     tiZANidine (ZANAFLEX) 4 MG tablet Take 4 mg by mouth in the morning, at noon, in the evening, and at bedtime.     valACYclovir (VALTREX) 500 MG tablet Take 500 mg by mouth daily.     Vitamin D, Ergocalciferol, (DRISDOL) 1.25 MG (50000 UNIT) CAPS capsule Take 50,000 Units by mouth once a week.     famotidine (PEPCID) 40 MG tablet Take by mouth.     naloxone (NARCAN) nasal spray 4 mg/0.1 mL Place 1 spray into the nose once.     oxyCODONE (OXY IR/ROXICODONE) 5 MG immediate release tablet Take 1 tablet (5 mg total) by mouth every 4 (four) hours as needed (pain  scale 4-7). (Patient not taking: Reported on 10/24/2021) 30 tablet 0     Physical Exam: Blood pressure 140/90, pulse 65, temperature 98.3 F (36.8 C), temperature source Axillary, resp. rate 18, height 5\' 2"  (1.575 m), weight 81.2 kg, SpO2 99 %, unknown if currently breastfeeding. ***  Results for orders placed or performed during the hospital encounter of 10/24/21 (from the past 48 hour(s))  Comprehensive metabolic panel     Status: Abnormal   Collection Time: 10/24/21  8:30 PM  Result Value Ref Range   Sodium 135 135 - 145 mmol/L   Potassium 4.1 3.5 - 5.1 mmol/L   Chloride 107 98 - 111 mmol/L   CO2 21 (L) 22 - 32 mmol/L   Glucose, Bld 115 (H) 70 - 99 mg/dL    Comment: Glucose reference range applies only to samples taken after fasting for at least 8 hours.   BUN 15 6 - 20 mg/dL   Creatinine, Ser 8.25 0.44 - 1.00 mg/dL   Calcium 8.1 (L) 8.9 - 10.3 mg/dL   Total Protein 6.0 (L) 6.5 - 8.1 g/dL   Albumin 2.9 (L) 3.5 - 5.0 g/dL   AST 12 (L) 15 - 41 U/L   ALT 13 0 - 44 U/L   Alkaline Phosphatase 74 38 - 126 U/L   Total Bilirubin 0.4 0.3 - 1.2 mg/dL   GFR, Estimated >00 >37 mL/min    Comment: (NOTE) Calculated using the CKD-EPI Creatinine Equation (2021)    Anion gap 7 5 - 15    Comment: Performed at [facility], [address] [place], [place] [address]  CBC with Differential     Status: Abnormal   Collection Time: 10/24/21  8:30 PM  Result Value Ref Range   WBC 8.3 4.0 - 10.5 K/uL   RBC 3.80 (L) 3.87 - 5.11 MIL/uL   Hemoglobin 5.9 (LL) 12.0 - 15.0 g/dL    Comment: REPEATED TO VERIFY Reticulocyte Hemoglobin testing may be clinically indicated, consider ordering this additional test [id number] THIS CRITICAL RESULT HAS VERIFIED AND BEEN CALLED TO ACrist Infante, RN BY MONICA RIVET ON 06 26 2023 AT 2112, AND HAS BEEN READ BACK.     HCT 22.0 (L) 36.0 - 46.0 %   MCV 57.9 (L) 80.0 - 100.0 fL   MCH 15.5 (L) 26.0 - 34.0 pg   MCHC 26.8 (L) 30.0 - 36.0 g/dL   RDW 88.8 (H) 28.0 -  15.5 %   Platelets 341 150 - 400 K/uL   nRBC 0.4 (H) 0.0 - 0.2 %   Neutrophils Relative % 69 %   Neutro Abs 5.7 1.7 - 7.7 K/uL   Lymphocytes Relative 21 %   Lymphs Abs 1.7 0.7 - 4.0 K/uL  Monocytes Relative 6 %   Monocytes Absolute 0.5 0.1 - 1.0 K/uL   Eosinophils Relative 3 %   Eosinophils Absolute 0.3 0.0 - 0.5 K/uL   Basophils Relative 1 %   Basophils Absolute 0.0 0.0 - 0.1 K/uL   WBC Morphology MORPHOLOGY UNREMARKABLE    Smear Review Normal platelet morphology    Immature Granulocytes 0 %   Abs Immature Granulocytes 0.03 0.00 - 0.07 K/uL   Schistocytes PRESENT    Tear Drop Cells PRESENT    Polychromasia PRESENT    Ovalocytes PRESENT     Comment: Performed at [facility], [place] [address], [place], [place] [address]  Type and screen [place]     Status: None   Collection Time: 10/24/21  8:30 PM  Result Value Ref Range   ABO/RH(D) B POS    Antibody Screen NEG    Sample Expiration 10/27/2021,2359    Unit Number [id number]    Blood Component Type RED CELLS,LR    Unit division 00    Status of Unit ISSUED,FINAL    Transfusion Status OK TO TRANSFUSE    Crossmatch Result Compatible    Unit Number [id number]    Blood Component Type RED CELLS,LR    Unit division 00    Status of Unit ISSUED,FINAL    Transfusion Status OK TO TRANSFUSE    Crossmatch Result Compatible    Unit Number [id number]    Blood Component Type RED CELLS,LR    Unit division 00    Status of Unit ISSUED,FINAL    Transfusion Status OK TO TRANSFUSE    Crossmatch Result      Compatible Performed at [facility], [place] [address] [place], [place] [address]   Pathologist smear review     Status: None   Collection Time: 10/24/21  8:30 PM  Result Value Ref Range   Path Review      Marked anisocytosis, hypochromia and poikilocytosis of erythrocytes.    Comment: A few fragmented erythrocytes and schistocytes are present. R/O hemolysis. Leukocytes and platelets are  normal. Reviewed by Unknown Jim, M.D. 10/25/2021 Performed at [facility], [place] [address], [place], [place] [address]   I-Stat beta hCG blood, ED     Status: None   Collection Time: 10/24/21  9:17 PM  Result Value Ref Range   I-stat hCG, quantitative <5.0 <5 mIU/mL   Comment 3            Comment:   GEST. AGE      CONC.  (mIU/mL)   <=1 WEEK        5 - 50     2 WEEKS       50 - 500     3 WEEKS       100 - 10,000     4 WEEKS     1,000 - 30,000        FEMALE AND NON-PREGNANT FEMALE:     LESS THAN 5 mIU/mL   Vitamin B12     Status: None   Collection Time: 10/24/21 11:00 PM  Result Value Ref Range   Vitamin B-12 341 180 - 914 pg/mL    Comment: (NOTE) This assay is not validated for testing neonatal or myeloproliferative syndrome specimens for Vitamin B12 levels. Performed at [facility], [place] [address] [place], [place] [address]   Folate     Status: None   Collection Time: 10/24/21 11:00 PM  Result Value Ref  Range   Folate 6.0 >5.9 ng/mL    Comment: Performed at [facility], [place] [address] [place], [place] [address]  Iron and TIBC     Status: Abnormal   Collection Time: 10/24/21 11:00 PM  Result Value Ref Range   Iron 17 (L) 28 - 170 ug/dL   TIBC 407 250 - 450 ug/dL   Saturation Ratios 4 (L) 10.4 - 31.8 %   UIBC 390 ug/dL    Comment: Performed at [facility], [place] [address] [place], [place] [address]  Ferritin     Status: None   Collection Time: 10/24/21 11:00 PM  Result Value Ref Range   Ferritin 15 11 - 307 ng/mL    Comment: Performed at [facility], [place] [address] [place], [place] [address]  Reticulocytes     Status: None   Collection Time: 10/24/21 11:00 PM  Result Value Ref Range   Retic Ct Pct RESULTS UNAVAILABLE DUE TO INTERFERING SUBSTANCE 0.4 - 3.1 %   RBC. 4.10 3.87 - 5.11 MIL/uL   Retic Count, Absolute NOT CALCULATED 19.0 - 186.0 K/uL    Comment: CORRECTED RESULTS CALLED TO: [place], [place] 06.27.23 M.RIVET CORRECTED  ON 06/27 AT 0041: PREVIOUSLY REPORTED AS 96.8    Immature Retic Fract RESULTS UNAVAILABLE DUE TO INTERFERING SUBSTANCE 2.3 - 15.9 %    Comment: CORRECTED RESULTS CALLED TO: [place], [place] 06.27.23 M.RIVET Performed at [facility], [place] [address], [place], [place] [address] CORRECTED ON 06/27 AT 0041: PREVIOUSLY REPORTED AS 24.3   Prepare RBC (crossmatch)     Status: None   Collection Time: 10/24/21 11:00 PM  Result Value Ref Range   Order Confirmation      ORDER PROCESSED BY BLOOD BANK Performed at [facility], [place] [address], [place], [place] [address]   D-dimer, quantitative     Status: Abnormal   Collection Time: 10/24/21 11:00 PM  Result Value Ref Range   D-Dimer, Quant 0.84 (H) 0.00 - 0.50 ug/mL-FEU    Comment: (NOTE) At the manufacturer cut-off value of 0.5 g/mL FEU, this assay has a negative predictive value of 95-100%.This assay is intended for use in conjunction with a clinical pretest probability (PTP) assessment model to exclude pulmonary embolism (PE) and deep venous thrombosis (DVT) in outpatients suspected of PE or DVT. Results should be correlated with clinical presentation. Performed at [facility], [place] [address] [place], [place] [address]   Save Smear for Provider Slide Review     Status: None   Collection Time: 10/25/21  1:24 AM  Result Value Ref Range   Smear Review SMEAR STAINED AND AVAILABLE FOR REVIEW     Comment: Performed at [facility], [place] [address], [place], [place] [address]  Sedimentation rate     Status: Abnormal   Collection Time: 10/25/21  1:24 AM  Result Value Ref Range   Sed Rate 34 (H) 0 - 22 mm/hr    Comment: Performed at [facility] [address] [place], [place] [address]  C-reactive protein     Status: Abnormal   Collection Time: 10/25/21  1:24 AM  Result Value Ref Range   CRP 8.6 (H) <1.0 mg/dL    Comment: Performed at [facility] [address] [place], [place] [address]  ANA  w/Reflex if Positive     Status: None   Collection Time: 10/25/21  1:24 AM  Result Value Ref Range  Anti Nuclear Antibody (ANA) Negative Negative    Comment: (NOTE) Performed At: [facility] [place], [place] [id number] Rush Farmer MD [id number]   APTT     Status: None   Collection Time: 10/25/21  1:24 AM  Result Value Ref Range   aPTT 36 24 - 36 seconds    Comment: Performed at [facility], [place] [address], [place], [place] [address]  Protime-INR     Status: None   Collection Time: 10/25/21  1:24 AM  Result Value Ref Range   Prothrombin Time 14.6 11.4 - 15.2 seconds   INR 1.1 0.8 - 1.2    Comment: (NOTE) INR goal varies based on device and disease states. Performed at [facility], [place] [address] [place], [place] [address]   Lactate dehydrogenase     Status: None   Collection Time: 10/25/21  1:24 AM  Result Value Ref Range   LDH 162 98 - 192 U/L    Comment: Performed at [facility], [place] [address], [place], [place] [address]  CK     Status: None   Collection Time: 10/25/21  1:24 AM  Result Value Ref Range   Total CK 55 38 - 234 U/L    Comment: Performed at [facility], [place] [address] [place], [place] [address]  Prealbumin     Status: Abnormal   Collection Time: 10/25/21  1:24 AM  Result Value Ref Range   Prealbumin 9.4 (L) 18 - 38 mg/dL    Comment: Performed at [facility] [address] [place] [address] [place], [place] [address]  TSH     Status: None   Collection Time: 10/25/21  1:24 AM  Result Value Ref Range   TSH 1.302 0.350 - 4.500 uIU/mL    Comment: Performed by a 3rd Generation assay with a functional sensitivity of <=0.01 uIU/mL. Performed at [facility], [place] [address], [place], [place] [address]   Phosphorus     Status: None   Collection Time: 10/25/21  1:24 AM  Result Value Ref Range   Phosphorus 3.6 2.5 - 4.6 mg/dL    Comment: Performed at [facility] [address] [place], [place]  [address]  Magnesium     Status: None   Collection Time: 10/25/21  1:24 AM  Result Value Ref Range   Magnesium 2.3 1.7 - 2.4 mg/dL    Comment: Performed at [facility] [address] [place], [place] [address]  Urinalysis, Complete w Microscopic Urine, Clean Catch     Status: Abnormal   Collection Time: 10/25/21  4:00 AM  Result Value Ref Range   Color, Urine YELLOW YELLOW   APPearance HAZY (A) CLEAR   Specific Gravity, Urine 1.009 1.005 - 1.030   pH 7.0 5.0 - 8.0   Glucose, UA NEGATIVE NEGATIVE mg/dL   Hgb urine dipstick NEGATIVE NEGATIVE   Bilirubin Urine NEGATIVE NEGATIVE   Ketones, ur NEGATIVE NEGATIVE mg/dL   Protein, ur NEGATIVE NEGATIVE mg/dL   Nitrite NEGATIVE NEGATIVE   Leukocytes,Ua SMALL (A) NEGATIVE   RBC / HPF 0-5 0 - 5 RBC/hpf   WBC, UA 6-10 0 - 5 WBC/hpf   Bacteria, UA MANY (A) NONE SEEN   Squamous Epithelial / LPF 0-5 0 - 5    Comment: Performed at [facility], [place] [address] [place], [place] [address]  Lactic acid, plasma     Status: None   Collection Time:  10/25/21  4:00 AM  Result Value Ref Range   Lactic Acid, Venous 0.9 0.5 - 1.9 mmol/L    Comment: Performed at [facility], [address] [place], [place] [address]  HIV Antibody (routine testing w rflx)     Status: None   Collection Time: 10/25/21  2:07 PM  Result Value Ref Range   HIV Screen 4th Generation wRfx Non Reactive Non Reactive    Comment: Performed at [facility], [address] [place], [place] [address]  Comprehensive metabolic panel     Status: Abnormal   Collection Time: 10/25/21  2:07 PM  Result Value Ref Range   Sodium 140 135 - 145 mmol/L   Potassium 4.0 3.5 - 5.1 mmol/L   Chloride 109 98 - 111 mmol/L   CO2 22 22 - 32 mmol/L   Glucose, Bld 113 (H) 70 - 99 mg/dL    Comment: Glucose reference range applies only to samples taken after fasting for at least 8 hours.   BUN 12 6 - 20 mg/dL   Creatinine, Ser 2.77 0.44 - 1.00 mg/dL   Calcium 8.5 (L) 8.9 - 10.3  mg/dL   Total Protein 6.0 (L) 6.5 - 8.1 g/dL   Albumin 2.9 (L) 3.5 - 5.0 g/dL   AST 11 (L) 15 - 41 U/L   ALT 13 0 - 44 U/L   Alkaline Phosphatase 74 38 - 126 U/L   Total Bilirubin 0.9 0.3 - 1.2 mg/dL   GFR, Estimated >82 >42 mL/min    Comment: (NOTE) Calculated using the CKD-EPI Creatinine Equation (2021)    Anion gap 9 5 - 15    Comment: Performed at [facility], [address], [place], [place] [address]  Hepatitis C antibody     Status: None   Collection Time: 10/25/21  2:07 PM  Result Value Ref Range   HCV Ab NON REACTIVE NON REACTIVE    Comment: (NOTE) Nonreactive HCV antibody screen is consistent with no HCV infections,  unless recent infection is suspected or other evidence exists to indicate HCV infection.  Performed at [facility], [address] [place], [place] [address]   Hepatitis B surface antibody,qualitative     Status: None   Collection Time: 10/25/21  2:07 PM  Result Value Ref Range   Hep B S Ab NON REACTIVE NON REACTIVE    Comment: (NOTE) Inconsistent with immunity, less than 10 mIU/mL.  Performed at [facility], [address], [place], [place] [address]   Hepatitis B core antibody, total     Status: None   Collection Time: 10/25/21  2:07 PM  Result Value Ref Range   Hep B Core Total Ab NON REACTIVE NON REACTIVE    Comment: Performed at [facility], [address], [place], [place] [address]  CBC     Status: Abnormal   Collection Time: 10/25/21  2:07 PM  Result Value Ref Range   WBC 6.6 4.0 - 10.5 K/uL   RBC 4.69 3.87 - 5.11 MIL/uL   Hemoglobin 8.8 (L) 12.0 - 15.0 g/dL    Comment: Reticulocyte Hemoglobin testing may be clinically indicated, consider ordering this additional test [id number] REPEATED TO VERIFY POST TRANSFUSION SPECIMEN    HCT 29.5 (L) 36.0 - 46.0 %   MCV 62.9 (L) 80.0 - 100.0 fL    Comment: POST TRANSFUSION SPECIMEN REPEATED TO VERIFY    MCH 18.8 (L) 26.0 - 34.0 pg   MCHC  29.8 (L) 30.0 - 36.0 g/dL    RDW 29.2 (H) 11.5 - 15.5 %   Platelets  150 - 400 K/uL    PLATELET CLUMPS NOTED ON SMEAR, COUNT APPEARS ADEQUATE   nRBC 1.4 (H) 0.0 - 0.2 %    Comment: Performed at [facility] [address], [place], [place] [address]  Hepatitis B surface antigen     Status: None   Collection Time: 10/25/21  2:07 PM  Result Value Ref Range   Hepatitis B Surface Ag NON REACTIVE NON REACTIVE    Comment: Performed at [facility] [address], [place], [place] [address]  CBC with Differential/Platelet     Status: Abnormal   Collection Time: 10/26/21  6:47 AM  Result Value Ref Range   WBC 5.1 4.0 - 10.5 K/uL   RBC 4.58 3.87 - 5.11 MIL/uL   Hemoglobin 8.5 (L) 12.0 - 15.0 g/dL    Comment: Reticulocyte Hemoglobin testing may be clinically indicated, consider ordering this additional test [id number]    HCT 28.6 (L) 36.0 - 46.0 %   MCV 62.4 (L) 80.0 - 100.0 fL   MCH 18.6 (L) 26.0 - 34.0 pg   MCHC 29.7 (L) 30.0 - 36.0 g/dL   RDW 29.1 (H) 11.5 - 15.5 %   Platelets 349 150 - 400 K/uL    Comment: REPEATED TO VERIFY   nRBC 1.0 (H) 0.0 - 0.2 %   Neutrophils Relative % 39 %   Neutro Abs 2.0 1.7 - 7.7 K/uL   Lymphocytes Relative 38 %   Lymphs Abs 1.9 0.7 - 4.0 K/uL   Monocytes Relative 12 %   Monocytes Absolute 0.6 0.1 - 1.0 K/uL   Eosinophils Relative 9 %   Eosinophils Absolute 0.5 0.0 - 0.5 K/uL   Basophils Relative 1 %   Basophils Absolute 0.1 0.0 - 0.1 K/uL   Immature Granulocytes 1 %   Abs Immature Granulocytes 0.04 0.00 - 0.07 K/uL    Comment: Performed at [facility], [address] [place], [place] [address]  Reticulocytes     Status: Abnormal   Collection Time: 10/26/21  6:47 AM  Result Value Ref Range   Retic Ct Pct 1.7 0.4 - 3.1 %   RBC. 4.68 3.87 - 5.11 MIL/uL   Retic Count, Absolute 77.2 19.0 - 186.0 K/uL   Immature Retic Fract 38.4 (H) 2.3 - 15.9 %    Comment: Performed at [facility] [address], [place], [place] [address]   VAS [place] LOWER  EXTREMITY VENOUS (DVT)  Result Date: 10/25/2021  Lower Venous DVT Study Patient Name:  [facility]  Date of Exam:   10/25/2021 Medical Rec #: [id number]        Accession #:    [id number] Date of Birth: 1985-09-26       Patient Gender: F Patient Age:   73 years Exam Location:  [facility] Procedure:      VAS [place] LOWER EXTREMITY VENOUS (DVT) Referring Phys: Nyoka Lint DOUTOVA --------------------------------------------------------------------------------  Indications: Edema.  Risk Factors: None identified. Limitations: Poor ultrasound/tissue interface and patient pain tolerance. Comparison Study: No prior studies. Performing Technologist: Oliver Hum RVT  Examination Guidelines: A complete evaluation includes B-mode imaging, spectral Doppler, color Doppler, and power Doppler as needed of all accessible portions of each vessel. Bilateral testing is considered an integral part of a complete examination. Limited examinations for reoccurring indications may be performed as noted. The reflux portion of the exam  is performed with the patient in reverse Trendelenburg.  +---------+---------------+---------+-----------+----------+--------------+ RIGHT    CompressibilityPhasicitySpontaneityPropertiesThrombus Aging +---------+---------------+---------+-----------+----------+--------------+ CFV      Full           Yes      Yes                                 +---------+---------------+---------+-----------+----------+--------------+ SFJ      Full                                                        +---------+---------------+---------+-----------+----------+--------------+ FV Prox  Full                                                        +---------+---------------+---------+-----------+----------+--------------+ FV Mid   Full                                                        +---------+---------------+---------+-----------+----------+--------------+ FV DistalFull                                                         +---------+---------------+---------+-----------+----------+--------------+ PFV      Full                                                        +---------+---------------+---------+-----------+----------+--------------+ POP      Full           Yes      Yes                                 +---------+---------------+---------+-----------+----------+--------------+ PTV      Full                                                        +---------+---------------+---------+-----------+----------+--------------+ PERO     Full                                                        +---------+---------------+---------+-----------+----------+--------------+   +---------+---------------+---------+-----------+----------+--------------+ LEFT     CompressibilityPhasicitySpontaneityPropertiesThrombus Aging +---------+---------------+---------+-----------+----------+--------------+ CFV      Full           Yes      Yes                                 +---------+---------------+---------+-----------+----------+--------------+  SFJ      Full                                                        +---------+---------------+---------+-----------+----------+--------------+ FV Prox  Full                                                        +---------+---------------+---------+-----------+----------+--------------+ FV Mid   Full                                                        +---------+---------------+---------+-----------+----------+--------------+ FV DistalFull                                                        +---------+---------------+---------+-----------+----------+--------------+ PFV      Full                                                        +---------+---------------+---------+-----------+----------+--------------+ POP      Full           Yes      Yes                                  +---------+---------------+---------+-----------+----------+--------------+ PTV      Full                                                        +---------+---------------+---------+-----------+----------+--------------+ PERO     Full                                                        +---------+---------------+---------+-----------+----------+--------------+     Summary: RIGHT: - There is no evidence of deep vein thrombosis in the lower extremity.  - No cystic structure found in the popliteal fossa.  LEFT: - There is no evidence of deep vein thrombosis in the lower extremity.  - No cystic structure found in the popliteal fossa.  *See table(s) above for measurements and observations. Electronically signed by Harold Barban MD on 10/25/2021 at 10:48:49 PM.    Final    DG CHEST PORT 1 VIEW  Result Date: 10/25/2021 CLINICAL DATA:  Low serum hemoglobin. EXAM: PORTABLE CHEST 1 VIEW COMPARISON:  PA Lat 04/07/2009 FINDINGS: The heart size and mediastinal contours  are within normal limits, but with the heart upper-normal in size compared to the prior exam. Both lungs are clear. The visualized skeletal structures are unremarkable. IMPRESSION: Interval increased borderline prominence of the heart silhouette since the prior study. No vascular congestion or other acute radiographic chest findings. Electronically Signed   By: Almira Bar M.D.   On: 10/25/2021 01:48      Assessment/Plan Bilateral erythematous nodules of upper and lower extremities 36 y/o F admitted with symptomatic anemia which worked up and is likely due to an oozing gastric ulcer. She is now hemodynamically stable and general surgery is asked to consult for skin biopsy. Patient agreeable to procedure today. Please see separate procedure note for details. Biopsy will be sent to the lab for pathology.    Severe anemia due UGIB, now stable Chronic pain anxiety   I reviewed Consultant GI notes, hospitalist notes, last 24  h vitals and pain scores, last 48 h intake and output, last 24 h labs and trends, and last 24 h imaging results.  Adam Phenix, PA-C [facility] 10/26/2021, 1:13 PM Please see Amion for pager number during day hours 7:00am-4:30pm or 7:00am -11:30am on weekends

## 2021-10-26 NOTE — Transfer of Care (Signed)
Immediate Anesthesia Transfer of Care Note  Patient: Kelsey Lynch  Procedure(s) Performed: COLONOSCOPY WITH PROPOFOL ESOPHAGOGASTRODUODENOSCOPY (EGD) BIOPSY  Patient Location: Endoscopy Unit  Anesthesia Type:MAC  Level of Consciousness: awake and alert   Airway & Oxygen Therapy: Patient Spontanous Breathing and Patient connected to nasal cannula oxygen  Post-op Assessment: Report given to RN and Post -op Vital signs reviewed and stable  Post vital signs: Reviewed and stable  Last Vitals:  Vitals Value Taken Time  BP 112/88 10/26/21 1044  Temp    Pulse 68 10/26/21 1045  Resp 19 10/26/21 1045  SpO2 98 % 10/26/21 1045  Vitals shown include unvalidated device data.  Last Pain:  Vitals:   10/26/21 1044  TempSrc:   PainSc: 0-No pain      Patients Stated Pain Goal: 5 (68/34/19 6222)  Complications: No notable events documented.

## 2021-10-27 ENCOUNTER — Other Ambulatory Visit (HOSPITAL_COMMUNITY): Payer: Self-pay

## 2021-10-27 DIAGNOSIS — K633 Ulcer of intestine: Secondary | ICD-10-CM

## 2021-10-27 DIAGNOSIS — R195 Other fecal abnormalities: Secondary | ICD-10-CM | POA: Diagnosis not present

## 2021-10-27 DIAGNOSIS — D649 Anemia, unspecified: Secondary | ICD-10-CM | POA: Diagnosis not present

## 2021-10-27 DIAGNOSIS — K254 Chronic or unspecified gastric ulcer with hemorrhage: Secondary | ICD-10-CM | POA: Diagnosis not present

## 2021-10-27 LAB — CBC WITH DIFFERENTIAL/PLATELET
Abs Immature Granulocytes: 0.05 10*3/uL (ref 0.00–0.07)
Basophils Absolute: 0.1 10*3/uL (ref 0.0–0.1)
Basophils Relative: 1 %
Eosinophils Absolute: 0.4 10*3/uL (ref 0.0–0.5)
Eosinophils Relative: 7 %
HCT: 32.7 % — ABNORMAL LOW (ref 36.0–46.0)
Hemoglobin: 9.3 g/dL — ABNORMAL LOW (ref 12.0–15.0)
Immature Granulocytes: 1 %
Lymphocytes Relative: 40 %
Lymphs Abs: 2.3 10*3/uL (ref 0.7–4.0)
MCH: 18.1 pg — ABNORMAL LOW (ref 26.0–34.0)
MCHC: 28.4 g/dL — ABNORMAL LOW (ref 30.0–36.0)
MCV: 63.5 fL — ABNORMAL LOW (ref 80.0–100.0)
Monocytes Absolute: 0.5 10*3/uL (ref 0.1–1.0)
Monocytes Relative: 9 %
Neutro Abs: 2.6 10*3/uL (ref 1.7–7.7)
Neutrophils Relative %: 42 %
Platelets: 373 10*3/uL (ref 150–400)
RBC: 5.15 MIL/uL — ABNORMAL HIGH (ref 3.87–5.11)
RDW: 29.9 % — ABNORMAL HIGH (ref 11.5–15.5)
WBC: 5.9 10*3/uL (ref 4.0–10.5)
nRBC: 0.8 % — ABNORMAL HIGH (ref 0.0–0.2)

## 2021-10-27 LAB — HGB FRACTIONATION CASCADE
Hgb A2: 2.5 % (ref 1.8–3.2)
Hgb A: 96.7 % (ref 96.4–98.8)
Hgb F: 0.8 % (ref 0.0–2.0)
Hgb S: 0 %

## 2021-10-27 LAB — SURGICAL PATHOLOGY

## 2021-10-27 MED ORDER — PANTOPRAZOLE SODIUM 40 MG PO TBEC
DELAYED_RELEASE_TABLET | ORAL | 3 refills | Status: AC
  Filled 2021-10-27 – 2021-11-22 (×2): qty 60, 30d supply, fill #0

## 2021-10-27 MED ORDER — FERROUS SULFATE 325 (65 FE) MG PO TABS
ORAL_TABLET | Freq: Two times a day (BID) | ORAL | 1 refills | Status: AC
  Filled 2021-10-27 – 2021-11-22 (×2): qty 60, 30d supply, fill #0

## 2021-10-27 NOTE — Progress Notes (Signed)
Discharge paperwork reviewed with pt. Pt verbalized understanding. Pt's friend on the way to pick up pt to transport home via private vehicle. Medications from [facility] have been delivered.

## 2021-10-27 NOTE — Discharge Summary (Signed)
PATIENT DETAILS Name: Kelsey Lynch Age: 36 y.o. Sex: female Date of Birth: Apr 04, 1986 MRN: [id number]. Admitting Physician: Toy Baker, MD GMW:[id number], Jannifer Rodney, MD  Admit Date: 10/24/2021 Discharge date: 10/27/2021  Recommendations for Outpatient Follow-up:  Follow up with PCP in 1-2 weeks Please obtain CMP/CBC in one week Please follow skin biopys result Please ensure follow-up with gastroenterology  Admitted From:  Home  Disposition: Home   Discharge Condition: good  CODE STATUS:   Code Status: Full Code   Diet recommendation:  Diet Order             Diet general           Diet regular Room service appropriate? Yes; Fluid consistency: Thin  Diet effective now                    Brief Summary: Patient is a 36 y.o.  female with chronic pain syndrome-who was found to have iron deficiency anemia several months back-started on oral iron supplementation-presented to the hospital after she was found to have a hemoglobin of 5.8.  See below for further details.   Significant events: 6/26>> admit to [facility] for evaluation of severe anemia.   Significant studies: 6/27>> CXR: No PNA 6/27>> ESR: 34 6/27>> CRP: 8.6 6/27>> ANA: negative 6/27>> HBsAg/HCV Ab: Negative 6/27>> hemoglobin electrophoresis/fractionation: Pending 6/28>> skin biopsy   Significant microbiology data: None   Procedures: 6/28>> colonoscopy: Erosion in the ascending colon-likely NSAID related. 6/28>> EGD: Pyloric ulceration with partial stenosis   Consults: GI, oncology, general surgery  Brief Hospital Course: Severe anemia due to iron deficiency anemia-likely due to slow/chronic GI bleeding from gastric ulcer: No overt bleeding-hemoglobin stable after 3 units of PRBC-continue iron supplementation-continue  PPI twice daily x8 weeks, and once daily thereafter.  Avoid all NSAIDs.  GI will arrange for posthospital discharge visit.  Hemoglobin electrophoresis was negative.     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  Leukocytoclastic vasculitis-no culprit GI pathology seen on colonoscopy-discussed with patient-regarding watchful observation or obtaining a skin biopsy-she has opted for the latter-and subsequently underwent a skin biopsy on 6/28.  Biopsy results pending-patient aware that her primary care practitioner will need to follow on this biopsy results-and accordingly will need a rheumatology/dermatology referral.    Chronic back pain/chronic pain syndrome: Continue usual dosing of Neurontin/narcotics.  History of anxiety: Continue BuSpar   Obesity: Estimated body mass index is 32.74 kg/m as calculated from the following:   Height as of this encounter: 5' 2" (1.575 m).   Weight as of this encounter: 81.2 kg.    Discharge Diagnoses:  Principal Problem:   Symptomatic anemia Active Problems:   Anemia   Rash   Leg edema, left   Erosion of ascending colon   Heme positive stool   Chronic gastric ulcer with hemorrhage   Discharge Instructions:  Activity:  As tolerated   Discharge Instructions     Diet general   Complete by: As directed    Discharge instructions   Complete by: As directed    Follow with Primary MD  Katherina Mires, MD in 1-2 weeks  EGD biopsy results are pending and will need to be followed by your primary gastroenterologist or your primary care practitioner  Skin biopsy results are pending-they will need to be followed by your primary care practitioner-referral to dermatology or rheumatology will need to be made accordingly.  Avoid nonsteroidal anti-inflammatory medications (medications like ibuprofen, Motrin, Aleve, Advil etc.)  Please  get a complete blood count and chemistry panel checked by your Primary MD at your next visit, and again as instructed by your Primary MD.  Get Medicines reviewed and adjusted: Please take all your medications with you for your next visit with your Primary  MD  Laboratory/radiological data: Please request your Primary MD to go over all hospital tests and procedure/radiological results at the follow up, please ask your Primary MD to get all Hospital records sent to his/her office.  In some cases, they will be blood work, cultures and biopsy results pending at the time of your discharge. Please request that your primary care M.D. follows up on these results.  Also Note the following: If you experience worsening of your admission symptoms, develop shortness of breath, life threatening emergency, suicidal or homicidal thoughts you must seek medical attention immediately by calling 911 or calling your MD immediately  if symptoms less severe.  You must read complete instructions/literature along with all the possible adverse reactions/side effects for all the Medicines you take and that have been prescribed to you. Take any new Medicines after you have completely understood and accpet all the possible adverse reactions/side effects.   Do not drive when taking Pain medications or sleeping medications (Benzodaizepines)  Do not take more than prescribed Pain, Sleep and Anxiety Medications. It is not advisable to combine anxiety,sleep and pain medications without talking with your primary care practitioner  Special Instructions: If you have smoked or chewed Tobacco  in the last 2 yrs please stop smoking, stop any regular Alcohol  and or any Recreational drug use.  Wear Seat belts while driving.  Please note: You were cared for by a hospitalist during your hospital stay. Once you are discharged, your primary care physician will handle any further medical issues. Please note that NO REFILLS for any discharge medications will be authorized once you are discharged, as it is imperative that you return to your primary care physician (or establish a relationship with a primary care physician if you do not have one) for your post hospital discharge needs so that  they can reassess your need for medications and monitor your lab values.   Increase activity slowly   Complete by: As directed       Allergies as of 10/27/2021       Reactions   Penicillins Hives, Anaphylaxis   Has patient had a PCN reaction causing immediate rash, facial/tongue/throat swelling, SOB or lightheadedness with hypotension: Yes  Has patient had a PCN reaction causing severe rash involving mucus membranes or skin necrosis: Yes Has patient had a PCN reaction that required hospitalization No Has patient had a PCN reaction occurring within the last 10 years: Yes  If all of the above answers are "NO", then may proceed with Cephalosporin use. Has patient had a PCN reaction causing immediate rash, facial/tongue/throat swelling, SOB or lightheadedness with hypotension: Yes  Has patient had a PCN reaction causing severe rash involving mucus membranes or skin necrosis: Yes Has patient had a PCN reaction that required hospitalization No Has patient had a PCN reaction occurring within the last 10 years: Yes  If all of the above answers are "NO", then may proceed with Cephalosporin use.   Shellfish Allergy Anaphylaxis, Swelling   Hydroxyzine Rash   Meloxicam Rash        Medication List     STOP taking these medications    famotidine 40 MG tablet Commonly known as: PEPCID       TAKE these  medications    busPIRone 15 MG tablet Commonly known as: BUSPAR Take 15 mg by mouth 2 (two) times daily.   Butrans 20 MCG/HR Ptwk Generic drug: buprenorphine Place 1 patch onto the skin once a week.   ferrous sulfate 324 (65 Fe) MG Tbec Take 1 tablet (325 mg total) by mouth 2 (two) times daily. What changed: when to take this   gabapentin 600 MG tablet Commonly known as: NEURONTIN Take 600 mg by mouth in the morning, at noon, in the evening, and at bedtime.   medroxyPROGESTERone 150 MG/ML injection Commonly known as: DEPO-PROVERA Inject 150 mg into the muscle every 3 (three)  months.   naloxone 4 MG/0.1ML Liqd nasal spray kit Commonly known as: NARCAN Place 1 spray into the nose once.   oxyCODONE 15 MG immediate release tablet Commonly known as: ROXICODONE Take 15 mg by mouth every 6 (six) hours as needed. What changed: Another medication with the same name was removed. Continue taking this medication, and follow the directions you see here.   pantoprazole 40 MG tablet Commonly known as: PROTONIX Take twice daily x8 weeks, and then once daily thereafter.   tiZANidine 4 MG tablet Commonly known as: ZANAFLEX Take 4 mg by mouth in the morning, at noon, in the evening, and at bedtime.   valACYclovir 500 MG tablet Commonly known as: VALTREX Take 500 mg by mouth daily.   Vitamin D (Ergocalciferol) 1.25 MG (50000 UNIT) Caps capsule Commonly known as: DRISDOL Take 50,000 Units by mouth once a week.        Follow-up Information     Katherina Mires, MD. Schedule an appointment as soon as possible for a visit in 1 week(s).   Specialty: Family Medicine Contact information: [address] [place] [address] [phone]         [facility] Follow up.   Specialty: Gastroenterology Why: Office will call with date/time, If you dont hear from them,please give them a call Contact information: [place] [address] [phone]               Allergies  Allergen Reactions   Penicillins Hives and Anaphylaxis    Has patient had a PCN reaction causing immediate rash, facial/tongue/throat swelling, SOB or lightheadedness with hypotension: Yes  Has patient had a PCN reaction causing severe rash involving mucus membranes or skin necrosis: Yes Has patient had a PCN reaction that required hospitalization No Has patient had a PCN reaction occurring within the last 10 years: Yes  If all of the above answers are "NO", then may proceed with Cephalosporin use.  Has patient had a PCN  reaction causing immediate rash, facial/tongue/throat swelling, SOB or lightheadedness with hypotension: Yes  Has patient had a PCN reaction causing severe rash involving mucus membranes or skin necrosis: Yes Has patient had a PCN reaction that required hospitalization No Has patient had a PCN reaction occurring within the last 10 years: Yes  If all of the above answers are "NO", then may proceed with Cephalosporin use.    Shellfish Allergy Anaphylaxis and Swelling   Hydroxyzine Rash   Meloxicam Rash     Other Procedures/Studies: VAS [place] LOWER EXTREMITY VENOUS (DVT)  Result Date: 10/25/2021  Lower Venous DVT Study Patient Name:  [facility]  Date of Exam:   10/25/2021 Medical Rec #: [id number]        Accession #:    [id number] Date of Birth: 1986-02-18  Patient Gender: F Patient Age:   73 years Exam Location:  [facility] Procedure:      VAS [place] LOWER EXTREMITY VENOUS (DVT) Referring Phys: Nyoka Lint DOUTOVA --------------------------------------------------------------------------------  Indications: Edema.  Risk Factors: None identified. Limitations: Poor ultrasound/tissue interface and patient pain tolerance. Comparison Study: No prior studies. Performing Technologist: Oliver Hum RVT  Examination Guidelines: A complete evaluation includes B-mode imaging, spectral Doppler, color Doppler, and power Doppler as needed of all accessible portions of each vessel. Bilateral testing is considered an integral part of a complete examination. Limited examinations for reoccurring indications may be performed as noted. The reflux portion of the exam is performed with the patient in reverse Trendelenburg.  +---------+---------------+---------+-----------+----------+--------------+ RIGHT    CompressibilityPhasicitySpontaneityPropertiesThrombus Aging +---------+---------------+---------+-----------+----------+--------------+ CFV      Full           Yes      Yes                                  +---------+---------------+---------+-----------+----------+--------------+ SFJ      Full                                                        +---------+---------------+---------+-----------+----------+--------------+ FV Prox  Full                                                        +---------+---------------+---------+-----------+----------+--------------+ FV Mid   Full                                                        +---------+---------------+---------+-----------+----------+--------------+ FV DistalFull                                                        +---------+---------------+---------+-----------+----------+--------------+ PFV      Full                                                        +---------+---------------+---------+-----------+----------+--------------+ POP      Full           Yes      Yes                                 +---------+---------------+---------+-----------+----------+--------------+ PTV      Full                                                        +---------+---------------+---------+-----------+----------+--------------+  PERO     Full                                                        +---------+---------------+---------+-----------+----------+--------------+   +---------+---------------+---------+-----------+----------+--------------+ LEFT     CompressibilityPhasicitySpontaneityPropertiesThrombus Aging +---------+---------------+---------+-----------+----------+--------------+ CFV      Full           Yes      Yes                                 +---------+---------------+---------+-----------+----------+--------------+ SFJ      Full                                                        +---------+---------------+---------+-----------+----------+--------------+ FV Prox  Full                                                         +---------+---------------+---------+-----------+----------+--------------+ FV Mid   Full                                                        +---------+---------------+---------+-----------+----------+--------------+ FV DistalFull                                                        +---------+---------------+---------+-----------+----------+--------------+ PFV      Full                                                        +---------+---------------+---------+-----------+----------+--------------+ POP      Full           Yes      Yes                                 +---------+---------------+---------+-----------+----------+--------------+ PTV      Full                                                        +---------+---------------+---------+-----------+----------+--------------+ PERO     Full                                                        +---------+---------------+---------+-----------+----------+--------------+  Summary: RIGHT: - There is no evidence of deep vein thrombosis in the lower extremity.  - No cystic structure found in the popliteal fossa.  LEFT: - There is no evidence of deep vein thrombosis in the lower extremity.  - No cystic structure found in the popliteal fossa.  *See table(s) above for measurements and observations. Electronically signed by Harold Barban MD on 10/25/2021 at 10:48:49 PM.    Final    DG CHEST PORT 1 VIEW  Result Date: 10/25/2021 CLINICAL DATA:  Low serum hemoglobin. EXAM: PORTABLE CHEST 1 VIEW COMPARISON:  PA Lat 04/07/2009 FINDINGS: The heart size and mediastinal contours are within normal limits, but with the heart upper-normal in size compared to the prior exam. Both lungs are clear. The visualized skeletal structures are unremarkable. IMPRESSION: Interval increased borderline prominence of the heart silhouette since the prior study. No vascular congestion or other acute radiographic chest findings. Electronically  Signed   By: Telford Nab M.D.   On: 10/25/2021 01:48     TODAY-DAY OF DISCHARGE:  Subjective:   Reyne Dumas today has no headache,no chest abdominal pain,no new weakness tingling or numbness, feels much better wants to go home today.   Objective:   Blood pressure 122/68, pulse 70, temperature 98.3 F (36.8 C), temperature source Oral, resp. rate 17, height 5' 2" (1.575 m), weight 81.2 kg, SpO2 99 %, unknown if currently breastfeeding.  Intake/Output Summary (Last 24 hours) at 10/27/2021 1100 Last data filed at 10/26/2021 2010 Gross per 24 hour  Intake 480 ml  Output --  Net 480 ml   Filed Weights   10/24/21 2007  Weight: 81.2 kg    Exam: Awake Alert, Oriented *3, No new F.N deficits, Normal affect [place].AT,PERRAL Supple Neck,No JVD, No cervical lymphadenopathy appriciated.  Symmetrical Chest wall movement, Good air movement bilaterally, CTAB RRR,No Gallops,Rubs or new Murmurs, No Parasternal Heave +ve B.Sounds, Abd Soft, Non tender, No organomegaly appriciated, No rebound -guarding or rigidity. No Cyanosis, Clubbing or edema, No new Rash or bruise   PERTINENT RADIOLOGIC STUDIES: VAS [place] LOWER EXTREMITY VENOUS (DVT)  Result Date: 10/25/2021  Lower Venous DVT Study Patient Name:  [facility]  Date of Exam:   10/25/2021 Medical Rec #: [id number]        Accession #:    [id number] Date of Birth: [birth year]       Patient Gender: F Patient Age:   42 years Exam Location:  [facility] Procedure:      VAS [place] LOWER EXTREMITY VENOUS (DVT) Referring Phys: Nyoka Lint DOUTOVA --------------------------------------------------------------------------------  Indications: Edema.  Risk Factors: None identified. Limitations: Poor ultrasound/tissue interface and patient pain tolerance. Comparison Study: No prior studies. Performing Technologist: Oliver Hum RVT  Examination Guidelines: A complete evaluation includes B-mode imaging, spectral Doppler, color Doppler, and power Doppler  as needed of all accessible portions of each vessel. Bilateral testing is considered an integral part of a complete examination. Limited examinations for reoccurring indications may be performed as noted. The reflux portion of the exam is performed with the patient in reverse Trendelenburg.  +---------+---------------+---------+-----------+----------+--------------+ RIGHT    CompressibilityPhasicitySpontaneityPropertiesThrombus Aging +---------+---------------+---------+-----------+----------+--------------+ CFV      Full           Yes      Yes                                 +---------+---------------+---------+-----------+----------+--------------+ SFJ      Full                                                        +---------+---------------+---------+-----------+----------+--------------+  FV Prox  Full                                                        +---------+---------------+---------+-----------+----------+--------------+ FV Mid   Full                                                        +---------+---------------+---------+-----------+----------+--------------+ FV DistalFull                                                        +---------+---------------+---------+-----------+----------+--------------+ PFV      Full                                                        +---------+---------------+---------+-----------+----------+--------------+ POP      Full           Yes      Yes                                 +---------+---------------+---------+-----------+----------+--------------+ PTV      Full                                                        +---------+---------------+---------+-----------+----------+--------------+ PERO     Full                                                        +---------+---------------+---------+-----------+----------+--------------+    +---------+---------------+---------+-----------+----------+--------------+ LEFT     CompressibilityPhasicitySpontaneityPropertiesThrombus Aging +---------+---------------+---------+-----------+----------+--------------+ CFV      Full           Yes      Yes                                 +---------+---------------+---------+-----------+----------+--------------+ SFJ      Full                                                        +---------+---------------+---------+-----------+----------+--------------+ FV Prox  Full                                                        +---------+---------------+---------+-----------+----------+--------------+  FV Mid   Full                                                        +---------+---------------+---------+-----------+----------+--------------+ FV DistalFull                                                        +---------+---------------+---------+-----------+----------+--------------+ PFV      Full                                                        +---------+---------------+---------+-----------+----------+--------------+ POP      Full           Yes      Yes                                 +---------+---------------+---------+-----------+----------+--------------+ PTV      Full                                                        +---------+---------------+---------+-----------+----------+--------------+ PERO     Full                                                        +---------+---------------+---------+-----------+----------+--------------+     Summary: RIGHT: - There is no evidence of deep vein thrombosis in the lower extremity.  - No cystic structure found in the popliteal fossa.  LEFT: - There is no evidence of deep vein thrombosis in the lower extremity.  - No cystic structure found in the popliteal fossa.  *See table(s) above for measurements and observations. Electronically signed  by Harold Barban MD on 10/25/2021 at 10:48:49 PM.    Final      PERTINENT LAB RESULTS: CBC: Recent Labs    10/26/21 0647 10/27/21 0240  WBC 5.1 5.9  HGB 8.5* 9.3*  HCT 28.6* 32.7*  PLT 349 373   CMET CMP     Component Value Date/Time   NA 140 10/25/2021 1407   K 4.0 10/25/2021 1407   CL 109 10/25/2021 1407   CO2 22 10/25/2021 1407   GLUCOSE 113 (H) 10/25/2021 1407   BUN 12 10/25/2021 1407   CREATININE 0.99 10/25/2021 1407   CALCIUM 8.5 (L) 10/25/2021 1407   PROT 6.0 (L) 10/25/2021 1407   ALBUMIN 2.9 (L) 10/25/2021 1407   AST 11 (L) 10/25/2021 1407   ALT 13 10/25/2021 1407   ALKPHOS 74 10/25/2021 1407   BILITOT 0.9 10/25/2021 1407   GFRNONAA >60 10/25/2021 1407   GFRAA >60 07/03/2015 1829    GFR Estimated Creatinine Clearance: 78.3 mL/min (by C-G formula  based on SCr of 0.99 mg/dL). No results for input(s): "LIPASE", "AMYLASE" in the last 72 hours. Recent Labs    10/25/21 0124  CKTOTAL 64   Invalid input(s): "POCBNP" Recent Labs    10/24/21 2300  DDIMER 0.84*   No results for input(s): "HGBA1C" in the last 72 hours. No results for input(s): "CHOL", "HDL", "LDLCALC", "TRIG", "CHOLHDL", "LDLDIRECT" in the last 72 hours. Recent Labs    10/25/21 0124  TSH 1.302   Recent Labs    10/24/21 2300 10/26/21 0647  VITAMINB12 341  --   FOLATE 6.0  --   FERRITIN 15  --   TIBC 407  --   IRON 17*  --   RETICCTPCT RESULTS UNAVAILABLE DUE TO INTERFERING SUBSTANCE 1.7   Coags: Recent Labs    10/25/21 0124  INR 1.1   Microbiology: No results found for this or any previous visit (from the past 240 hour(s)).  FURTHER DISCHARGE INSTRUCTIONS:  Get Medicines reviewed and adjusted: Please take all your medications with you for your next visit with your Primary MD  Laboratory/radiological data: Please request your Primary MD to go over all hospital tests and procedure/radiological results at the follow up, please ask your Primary MD to get all Hospital records  sent to his/her office.  In some cases, they will be blood work, cultures and biopsy results pending at the time of your discharge. Please request that your primary care M.D. goes through all the records of your hospital data and follows up on these results.  Also Note the following: If you experience worsening of your admission symptoms, develop shortness of breath, life threatening emergency, suicidal or homicidal thoughts you must seek medical attention immediately by calling 911 or calling your MD immediately  if symptoms less severe.  You must read complete instructions/literature along with all the possible adverse reactions/side effects for all the Medicines you take and that have been prescribed to you. Take any new Medicines after you have completely understood and accpet all the possible adverse reactions/side effects.   Do not drive when taking Pain medications or sleeping medications (Benzodaizepines)  Do not take more than prescribed Pain, Sleep and Anxiety Medications. It is not advisable to combine anxiety,sleep and pain medications without talking with your primary care practitioner  Special Instructions: If you have smoked or chewed Tobacco  in the last 2 yrs please stop smoking, stop any regular Alcohol  and or any Recreational drug use.  Wear Seat belts while driving.  Please note: You were cared for by a hospitalist during your hospital stay. Once you are discharged, your primary care physician will handle any further medical issues. Please note that NO REFILLS for any discharge medications will be authorized once you are discharged, as it is imperative that you return to your primary care physician (or establish a relationship with a primary care physician if you do not have one) for your post hospital discharge needs so that they can reassess your need for medications and monitor your lab values.  Total Time spent coordinating discharge including counseling, education and  face to face time equals greater than 30 minutes.  Signed: Shanker Ghimire 10/27/2021 11:00 AM

## 2021-10-27 NOTE — Progress Notes (Signed)
Pt's mother has arrived to transport pt home via private vehicle. Pt alert and oriented x4 in no acute distress upon discharge. Pt has taken her belongings with her. Pt taken down to main entrance via wheelchair by volunteer services.

## 2021-10-27 NOTE — Progress Notes (Signed)
Pt states her mother will be here after 3pm to transport her home now.

## 2021-10-27 NOTE — Progress Notes (Signed)
[facility] Progress Note  1 Day Post-Op  Subjective: CC:  Kelsey Lynch. States her legs are still very sore. Bx sites sore but denies bleeding or issues overnight.  Objective: Vital signs in last 24 hours: Temp:  [97.3 F (36.3 C)-98.5 F (36.9 C)] 98.3 F (36.8 C) (06/29 0752) Pulse Rate:  [63-70] 70 (06/28 2000) Resp:  [12-22] 17 (06/28 2000) BP: (112-140)/(68-90) 122/68 (06/29 0752) SpO2:  [96 %-99 %] 99 % (06/28 2000) Last BM Date : 10/26/21  Intake/Output from previous day: 06/28 0701 - 06/29 0700 In: 480 [P.O.:480] Out: -  Intake/Output this shift: No intake/output data recorded.  PE: Gen:  Alert, NAD, pleasant Skin: warm and dry, no rashes; biosy sites clean and dry, dressings changed by me. No bleeding or cellulitis Psych: A&Ox3   Lab Results:  Recent Labs    10/26/21 0647 10/27/21 0240  WBC 5.1 5.9  HGB 8.5* 9.3*  HCT 28.6* 32.7*  PLT 349 373   BMET Recent Labs    10/24/21 2030 10/25/21 1407  NA 135 140  K 4.1 4.0  CL 107 109  CO2 21* 22  GLUCOSE 115* 113*  BUN 15 12  CREATININE 0.89 0.99  CALCIUM 8.1* 8.5*   PT/INR Recent Labs    10/25/21 0124  LABPROT 14.6  INR 1.1   CMP     Component Value Date/Time   NA 140 10/25/2021 1407   K 4.0 10/25/2021 1407   CL 109 10/25/2021 1407   CO2 22 10/25/2021 1407   GLUCOSE 113 (H) 10/25/2021 1407   BUN 12 10/25/2021 1407   CREATININE 0.99 10/25/2021 1407   CALCIUM 8.5 (L) 10/25/2021 1407   PROT 6.0 (L) 10/25/2021 1407   ALBUMIN 2.9 (L) 10/25/2021 1407   AST 11 (L) 10/25/2021 1407   ALT 13 10/25/2021 1407   ALKPHOS 74 10/25/2021 1407   BILITOT 0.9 10/25/2021 1407   GFRNONAA >60 10/25/2021 1407   GFRAA >60 07/03/2015 1829   Lipase     Component Value Date/Time   LIPASE 48 09/13/2010 1305       Studies/Results: VAS [place] LOWER EXTREMITY VENOUS (DVT)  Result Date: 10/25/2021  Lower Venous DVT Study Patient Name:  [facility]  Date of Exam:   10/25/2021 Medical Rec #:  [id number]        Accession #:    [id number] Date of Birth: 10-Feb-1986       Patient Gender: F Patient Age:   4 years Exam Location:  [facility] Procedure:      VAS [place] LOWER EXTREMITY VENOUS (DVT) Referring Phys: Jonny Ruiz DOUTOVA --------------------------------------------------------------------------------  Indications: Edema.  Risk Factors: None identified. Limitations: Poor ultrasound/tissue interface and patient pain tolerance. Comparison Study: No prior studies. Performing Technologist: Chanda Busing RVT  Examination Guidelines: A complete evaluation includes B-mode imaging, spectral Doppler, color Doppler, and power Doppler as needed of all accessible portions of each vessel. Bilateral testing is considered an integral part of a complete examination. Limited examinations for reoccurring indications may be performed as noted. The reflux portion of the exam is performed with the patient in reverse Trendelenburg.  +---------+---------------+---------+-----------+----------+--------------+ RIGHT    CompressibilityPhasicitySpontaneityPropertiesThrombus Aging +---------+---------------+---------+-----------+----------+--------------+ CFV      Full           Yes      Yes                                 +---------+---------------+---------+-----------+----------+--------------+ SFJ  Full                                                        +---------+---------------+---------+-----------+----------+--------------+ FV Prox  Full                                                        +---------+---------------+---------+-----------+----------+--------------+ FV Mid   Full                                                        +---------+---------------+---------+-----------+----------+--------------+ FV DistalFull                                                        +---------+---------------+---------+-----------+----------+--------------+ PFV      Full                                                         +---------+---------------+---------+-----------+----------+--------------+ POP      Full           Yes      Yes                                 +---------+---------------+---------+-----------+----------+--------------+ PTV      Full                                                        +---------+---------------+---------+-----------+----------+--------------+ PERO     Full                                                        +---------+---------------+---------+-----------+----------+--------------+   +---------+---------------+---------+-----------+----------+--------------+ LEFT     CompressibilityPhasicitySpontaneityPropertiesThrombus Aging +---------+---------------+---------+-----------+----------+--------------+ CFV      Full           Yes      Yes                                 +---------+---------------+---------+-----------+----------+--------------+ SFJ      Full                                                        +---------+---------------+---------+-----------+----------+--------------+  FV Prox  Full                                                        +---------+---------------+---------+-----------+----------+--------------+ FV Mid   Full                                                        +---------+---------------+---------+-----------+----------+--------------+ FV DistalFull                                                        +---------+---------------+---------+-----------+----------+--------------+ PFV      Full                                                        +---------+---------------+---------+-----------+----------+--------------+ POP      Full           Yes      Yes                                 +---------+---------------+---------+-----------+----------+--------------+ PTV      Full                                                         +---------+---------------+---------+-----------+----------+--------------+ PERO     Full                                                        +---------+---------------+---------+-----------+----------+--------------+     Summary: RIGHT: - There is no evidence of deep vein thrombosis in the lower extremity.  - No cystic structure found in the popliteal fossa.  LEFT: - There is no evidence of deep vein thrombosis in the lower extremity.  - No cystic structure found in the popliteal fossa.  *See table(s) above for measurements and observations. Electronically signed by Coral Else MD on 10/25/2021 at 10:48:49 PM.    Final     Anti-infectives: Anti-infectives (From admission, onward)    Start     Dose/Rate Route Frequency Ordered Stop   10/25/21 1000  valACYclovir (VALTREX) tablet 500 mg        500 mg Oral Daily 10/25/21 3875          Assessment/Plan  Bilateral erythematous nodules of upper and lower extremities  S/p punch Bx 6/28. Wound sites are clean, no complications. Path pending. Ok to shower daily with soap and water. Call as needed, [facility] will sign off.   LOS: 2 days  Obie Dredge, PA-C [facility] Please see Amion for pager number during day hours 7:00am-4:30pm

## 2021-11-03 LAB — SURGICAL PATHOLOGY

## 2021-11-23 ENCOUNTER — Encounter: Payer: Self-pay | Admitting: Internal Medicine

## 2021-11-23 ENCOUNTER — Ambulatory Visit: Payer: Medicaid Other | Admitting: Internal Medicine

## 2021-11-23 VITALS — BP 116/70 | HR 90 | Ht 62.0 in | Wt 162.0 lb

## 2021-11-23 DIAGNOSIS — D509 Iron deficiency anemia, unspecified: Secondary | ICD-10-CM

## 2021-11-23 DIAGNOSIS — K259 Gastric ulcer, unspecified as acute or chronic, without hemorrhage or perforation: Secondary | ICD-10-CM | POA: Diagnosis not present

## 2021-11-23 NOTE — Progress Notes (Signed)
HISTORY OF PRESENT ILLNESS:  Kelsey Lynch is a 37 y.o. female who was evaluated in the hospital as an unassigned patient, in consultation, regarding symptomatic iron deficiency anemia and Hemoccult positive stool.  Admission hemoglobin was 5.9 with microcytosis.  She was transfused.  She subsequently underwent colonoscopy and upper endoscopy on October 26, 2021.  Colonoscopy revealed a single erosion in the ascending colon but was otherwise normal.  The ileum was normal.  Biopsies of the erosion were unremarkable.  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  She had been on chronic NSAIDs.  She was instructed to discontinue NSAIDs.  She was placed on pantoprazole 40 mg twice daily.  She continues on this medication at that dosage.  She was also placed on iron replacement orally twice daily.  She continues on this without difficulty.  She presents today for outpatient follow-up as recommended.  Her hospital discharge hemoglobin was 9.3.  She tells me that she had follow-up CBC with her primary care provider last week.  She tells me her hemoglobin was in the 10 range.  She still reports fatigue and decreased appetite.  No abdominal pain or vomiting.  No melena.  No other relevant complaints.  She does take narcotics for chronic pain.  Question of hemoglobinopathy was raised in the hospital.  However, looking at remote hemoglobin levels of 13-15, this seems quite unlikely.  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    HSV infection    no outbreak during pregnancy   Infection    UTI   Neck pain    Opioid dependence in remission ([place])    STD (sexually transmitted disease)    herpes   Vaginal Pap smear, abnormal     bx and trx    Past Surgical History:  Procedure Laterality Date   BIOPSY  10/26/2021   Procedure: BIOPSY;  Surgeon: Irene Shipper, MD;  Location: [place];  Service: Gastroenterology;;   COLONOSCOPY WITH PROPOFOL N/A 10/26/2021   Procedure: COLONOSCOPY WITH PROPOFOL;  Surgeon: Irene Shipper, MD;  Location: [place];  Service: Gastroenterology;  Laterality: N/A;   ESOPHAGOGASTRODUODENOSCOPY N/A 10/26/2021   Procedure: ESOPHAGOGASTRODUODENOSCOPY (EGD);  Surgeon: Irene Shipper, MD;  Location: [place];  Service: Gastroenterology;  Laterality: N/A;   NO PAST SURGERIES      Social History Christyne Mccain  reports that she has quit smoking. Her smoking use included cigarettes. She has never used smokeless tobacco. She reports that she does not currently use alcohol. She reports current drug use. Drug: Oxycodone.  family history includes Diabetes in her paternal grandmother; Hypertension in her paternal grandmother.  Allergies  Allergen Reactions   Penicillins Hives and Anaphylaxis    Has patient had a PCN reaction causing immediate rash, facial/tongue/throat swelling, SOB or lightheadedness with hypotension: Yes  Has patient had a PCN reaction causing severe rash involving mucus membranes or skin necrosis: Yes Has patient had a PCN reaction that required hospitalization No Has patient had a PCN reaction occurring within the last 10 years: Yes  If all of the above answers are "NO", then may proceed with Cephalosporin use.  Has patient had a PCN reaction causing immediate rash,  facial/tongue/throat swelling, SOB or lightheadedness with hypotension: Yes  Has patient had a PCN reaction causing severe rash involving mucus membranes or skin necrosis: Yes Has patient had a PCN reaction that required hospitalization No Has patient had a PCN reaction occurring within the last 10 years: Yes  If all of the above answers are "NO", then may proceed with Cephalosporin use.    Shellfish  Allergy Anaphylaxis and Swelling   Hydroxyzine Rash   Meloxicam Rash       PHYSICAL EXAMINATION: Vital signs: BP 116/70   Pulse 90   Ht 5' 2"  (1.575 m)   Wt 162 lb (73.5 kg)   SpO2 99%   BMI 29.63 kg/m   Constitutional: generally well-appearing, no acute distress Psychiatric: alert and oriented x3, cooperative Eyes: extraocular movements intact, anicteric, conjunctiva pink Mouth: oral pharynx moist, no lesions Neck: supple no lymphadenopathy Cardiovascular: heart regular rate and rhythm, no murmur Lungs: clear to auscultation bilaterally Abdomen: soft, nontender, nondistended, no obvious ascites, no peritoneal signs, normal bowel sounds, no organomegaly Rectal: Omitted Extremities: no clubbing, cyanosis, or lower extremity edema bilaterally Skin: no lesions on visible extremities.  Save tattoos Neuro: No focal deficits.  Cranial nerves intact  ASSESSMENT:  1.  Iron deficiency anemia and Hemoccult positive stool secondary to NSAID related gastric ulceration.  Blood counts improving on iron replacement.   PLAN:  1.  Complete an 8-week course of pantoprazole 40 mg twice daily.  Then transition to 40 mg daily thereafter indefinitely. 2.  Continue on iron replacement therapy orally until both her blood counts and iron levels are well within the normal range.  Can discontinue thereafter 3.  Close monitoring of blood counts and iron levels with her PCP.  If adequate iron replacement cannot be achieved with oral iron, then she should be referred to hematology for IV iron placement. 4.  Routine screening colonoscopy age 8 5.  Resume general medical care with Dr. Doreene Nest.  GI follow-up as needed A total time of 30 minutes was spent preparing to see the patient, reviewing the myriad of data, obtaining interval history, performing medically appropriate physical examination, counseling the patient regarding her above listed issues and educating her regarding the above listed issues.   Prescribing medication and directing medical therapy.  Recommending follow-up parameters and documenting clinical information in the health record

## 2021-11-23 NOTE — Patient Instructions (Signed)
If you are age 36 or older, your body mass index should be between 23-30. Your Body mass index is 29.63 kg/m. If this is out of the aforementioned range listed, please consider follow up with your Primary Care Provider.  If you are age 28 or younger, your body mass index should be between 19-25. Your Body mass index is 29.63 kg/m. If this is out of the aformentioned range listed, please consider follow up with your Primary Care Provider.   ________________________________________________________  The [facility] providers would like to encourage you to use [facility] to communicate with providers for non-urgent requests or questions.  Due to long hold times on the telephone, sending your provider a message by [facility] may be a faster and more efficient way to get a response.  Please allow 48 business hours for a response.  Please remember that this is for non-urgent requests.  _______________________________________________________   After 8 weeks of twice a day PPI then down to once a day  F/u as needed

## 2021-11-25 ENCOUNTER — Other Ambulatory Visit (HOSPITAL_COMMUNITY): Payer: Self-pay
